# Patient Record
Sex: Female | Born: 1960 | Race: White | Hispanic: No | Marital: Married | Smoking: Never smoker
Health system: Southern US, Community
[De-identification: ages and names within clinical notes are randomized; demographics above are authoritative.]

## PROBLEM LIST (undated history)

## (undated) DIAGNOSIS — R519 Headache, unspecified: Secondary | ICD-10-CM

## (undated) DIAGNOSIS — E7800 Pure hypercholesterolemia, unspecified: Secondary | ICD-10-CM

## (undated) DIAGNOSIS — I81 Portal vein thrombosis: Secondary | ICD-10-CM

## (undated) DIAGNOSIS — F419 Anxiety disorder, unspecified: Secondary | ICD-10-CM

## (undated) DIAGNOSIS — G4700 Insomnia, unspecified: Secondary | ICD-10-CM

## (undated) DIAGNOSIS — R7303 Prediabetes: Secondary | ICD-10-CM

## (undated) HISTORY — PX: PARTIAL HYSTERECTOMY: SHX80

## (undated) HISTORY — PX: CYSTOCELE REPAIR: SHX163

## (undated) HISTORY — PX: HERNIA REPAIR: SHX51

## (undated) HISTORY — PX: TONSILLECTOMY: SUR1361

## (undated) HISTORY — PX: BUNIONECTOMY: SHX129

## (undated) HISTORY — PX: COLONOSCOPY: SHX174

---

## 2012-10-11 ENCOUNTER — Ambulatory Visit: Payer: Self-pay | Admitting: Internal Medicine

## 2012-11-08 ENCOUNTER — Inpatient Hospital Stay: Payer: Self-pay | Admitting: Internal Medicine

## 2012-11-08 LAB — URINALYSIS, COMPLETE
Bilirubin,UR: NEGATIVE
Blood: NEGATIVE
Glucose,UR: NEGATIVE mg/dL (ref 0–75)
Ketone: NEGATIVE
Nitrite: NEGATIVE
Ph: 6 (ref 4.5–8.0)
Specific Gravity: 1.015 (ref 1.003–1.030)
Squamous Epithelial: 2

## 2012-11-08 LAB — COMPREHENSIVE METABOLIC PANEL
Albumin: 4 g/dL (ref 3.4–5.0)
BUN: 19 mg/dL — ABNORMAL HIGH (ref 7–18)
Bilirubin,Total: 0.2 mg/dL (ref 0.2–1.0)
Calcium, Total: 9.1 mg/dL (ref 8.5–10.1)
Chloride: 105 mmol/L (ref 98–107)
EGFR (Non-African Amer.): >60
SGOT(AST): 36 U/L (ref 15–37)
Total Protein: 7.7 g/dL (ref 6.4–8.2)

## 2012-11-08 LAB — CBC
HCT: 40.5 % (ref 35.0–47.0)
HCT: 37.7 % (ref 35.0–47.0)
HGB: 13.1 g/dL (ref 12.0–16.0)
MCH: 31.4 pg (ref 26.0–34.0)
MCV: 91 fL (ref 80–100)
MCV: 91 fL (ref 80–100)
Platelet: 256 10*3/uL (ref 150–440)
Platelet: 223 10*3/uL (ref 150–440)
RBC: 4.15 10*6/uL (ref 3.80–5.20)
RDW: 12.4 % (ref 11.5–14.5)
RDW: 12.4 % (ref 11.5–14.5)
WBC: 13.7 10*3/uL — ABNORMAL HIGH (ref 3.6–11.0)

## 2012-11-08 LAB — CLOSTRIDIUM DIFFICILE BY PCR

## 2012-11-08 LAB — APTT: Activated PTT: 77.9 secs — ABNORMAL HIGH (ref 23.6–35.9)

## 2012-11-08 LAB — PROTIME-INR
INR: 0.9
Prothrombin Time: 12.6 secs (ref 11.5–14.7)

## 2012-11-08 LAB — LIPASE, BLOOD: Lipase: 80 U/L (ref 73–393)

## 2012-11-09 LAB — CBC WITH DIFFERENTIAL/PLATELET
Basophil %: 0.3 %
Eosinophil #: 0.1 10*3/uL (ref 0.0–0.7)
Eosinophil %: 0.7 %
HCT: 39.3 % (ref 35.0–47.0)
HGB: 13.6 g/dL (ref 12.0–16.0)
MCH: 31.6 pg (ref 26.0–34.0)
MCHC: 34.7 g/dL (ref 32.0–36.0)
MCV: 91 fL (ref 80–100)
Monocyte #: 1 x10 3/mm — ABNORMAL HIGH (ref 0.2–0.9)
Neutrophil #: 10.1 10*3/uL — ABNORMAL HIGH (ref 1.4–6.5)
Neutrophil %: 71.3 %
Platelet: 202 10*3/uL (ref 150–440)
RBC: 4.31 10*6/uL (ref 3.80–5.20)
RDW: 12.7 % (ref 11.5–14.5)
WBC: 14.1 10*3/uL — ABNORMAL HIGH (ref 3.6–11.0)

## 2012-11-09 LAB — BASIC METABOLIC PANEL
Anion Gap: 7 (ref 7–16)
BUN: 8 mg/dL (ref 7–18)
Chloride: 107 mmol/L (ref 98–107)
Co2: 25 mmol/L (ref 21–32)
Creatinine: 0.87 mg/dL (ref 0.60–1.30)
Glucose: 114 mg/dL — ABNORMAL HIGH (ref 65–99)
Osmolality: 277 (ref 275–301)
Sodium: 139 mmol/L (ref 136–145)

## 2012-11-09 LAB — WBCS, STOOL

## 2012-11-09 LAB — TSH: Thyroid Stimulating Horm: 5.93 u[IU]/mL — ABNORMAL HIGH

## 2012-11-09 LAB — MAGNESIUM: Magnesium: 1.8 mg/dL

## 2012-11-10 LAB — PLATELET COUNT
Platelet: 142 10*3/uL — ABNORMAL LOW (ref 150–440)
Platelet: 128 10*3/uL — ABNORMAL LOW (ref 150–440)
Platelet: 128 10*3/uL — ABNORMAL LOW (ref 150–440)

## 2012-11-10 LAB — STOOL CULTURE

## 2012-11-10 LAB — HEMOGLOBIN: HGB: 11 g/dL — ABNORMAL LOW (ref 12.0–16.0)

## 2012-11-11 LAB — CBC WITH DIFFERENTIAL/PLATELET
Basophil #: 0 10*3/uL (ref 0.0–0.1)
Basophil %: 0.4 %
Eosinophil #: 0.2 10*3/uL (ref 0.0–0.7)
Eosinophil %: 2.1 %
HGB: 11 g/dL — ABNORMAL LOW (ref 12.0–16.0)
Lymphocyte %: 35.3 %
MCH: 32 pg (ref 26.0–34.0)
MCHC: 35 g/dL (ref 32.0–36.0)
Monocyte %: 6.4 %
Neutrophil #: 4.1 10*3/uL (ref 1.4–6.5)
Neutrophil %: 55.8 %
Platelet: 136 10*3/uL — ABNORMAL LOW (ref 150–440)
RBC: 3.43 10*6/uL — ABNORMAL LOW (ref 3.80–5.20)
WBC: 7.4 10*3/uL (ref 3.6–11.0)

## 2012-11-11 LAB — APTT: Activated PTT: 112.6 secs — ABNORMAL HIGH (ref 23.6–35.9)

## 2012-11-12 ENCOUNTER — Ambulatory Visit: Payer: Self-pay | Admitting: Internal Medicine

## 2012-11-12 LAB — CBC CANCER CENTER
Eosinophil %: 1.1 %
HCT: 33.9 % — ABNORMAL LOW (ref 35.0–47.0)
HGB: 11.7 g/dL — ABNORMAL LOW (ref 12.0–16.0)
MCH: 31.5 pg (ref 26.0–34.0)
MCV: 91 fL (ref 80–100)
Monocyte %: 6.7 %
Neutrophil #: 5.3 x10 3/mm (ref 1.4–6.5)
Neutrophil %: 68 %
Platelet: 165 x10 3/mm (ref 150–440)
RBC: 3.72 10*6/uL — ABNORMAL LOW (ref 3.80–5.20)
RDW: 12.3 % (ref 11.5–14.5)
WBC: 7.8 x10 3/mm (ref 3.6–11.0)

## 2012-11-13 LAB — CA 125: CA 125: 15.6 U/mL (ref 0.0–34.0)

## 2012-11-14 LAB — CULTURE, BLOOD (SINGLE)

## 2012-12-11 ENCOUNTER — Ambulatory Visit: Payer: Self-pay | Admitting: Internal Medicine

## 2013-01-11 ENCOUNTER — Ambulatory Visit: Payer: Self-pay | Admitting: Internal Medicine

## 2013-02-11 ENCOUNTER — Ambulatory Visit: Payer: Self-pay | Admitting: Internal Medicine

## 2013-02-11 ENCOUNTER — Ambulatory Visit: Payer: Self-pay | Admitting: Unknown Physician Specialty

## 2013-05-13 ENCOUNTER — Ambulatory Visit: Payer: Self-pay | Admitting: Gastroenterology

## 2013-05-29 ENCOUNTER — Ambulatory Visit: Payer: Self-pay | Admitting: Internal Medicine

## 2013-06-13 ENCOUNTER — Ambulatory Visit: Payer: Self-pay | Admitting: Internal Medicine

## 2013-08-14 ENCOUNTER — Ambulatory Visit: Payer: Self-pay | Admitting: Internal Medicine

## 2013-08-14 LAB — CREATININE, SERUM
Creatinine: 0.76 mg/dL (ref 0.60–1.30)
EGFR (Non-African Amer.): >60

## 2013-09-11 ENCOUNTER — Ambulatory Visit: Payer: Self-pay | Admitting: Internal Medicine

## 2013-11-22 IMAGING — CT CT ABD-PELV W/ CM
1 of 2 series · 15 of 32 positions shown, 19 images · IV contrast (isovue)
Comparison: none

REASON FOR EXAM: (1) abd pain - lower abd, with diarrhea and blood in
stool.  eval for inflammato
COMMENTS:   May transport without cardiac monitor

PROCEDURE:     CT  - CT ABDOMEN / PELVIS  W  - November 08, 2012 [DATE]
RESULT:     Comparison:  None
TECHNIQUE: Multiple axial images of the abdomen and pelvis were performed
from the lung bases to the pubic symphysis, with p.o. contrast and with 100
mL of Isovue 370 intravenous contrast.

[Series 2: 3mm soft tissue · axial · 0.68mm/px · z∈[-1184,-766]mm · 15 of 153 slices shown, 19 images]
[im 7/153  soft-tissue]
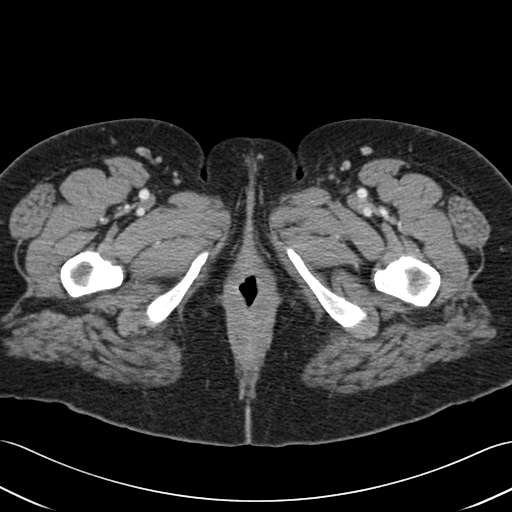
[im 7/153  bone]
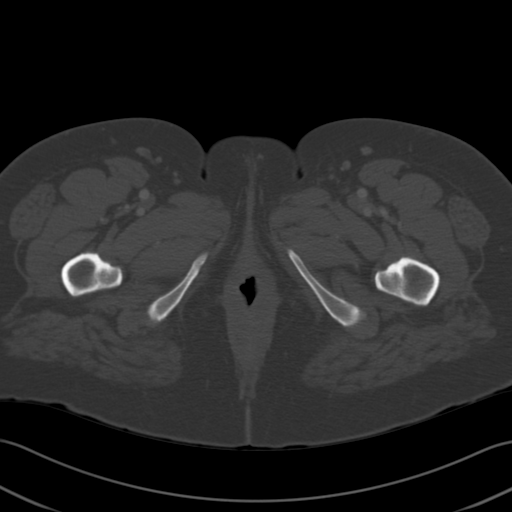
[im 20/153  soft-tissue]
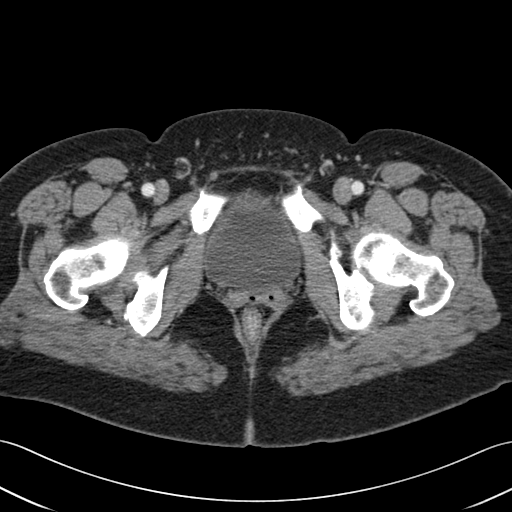
[im 32/153  soft-tissue]
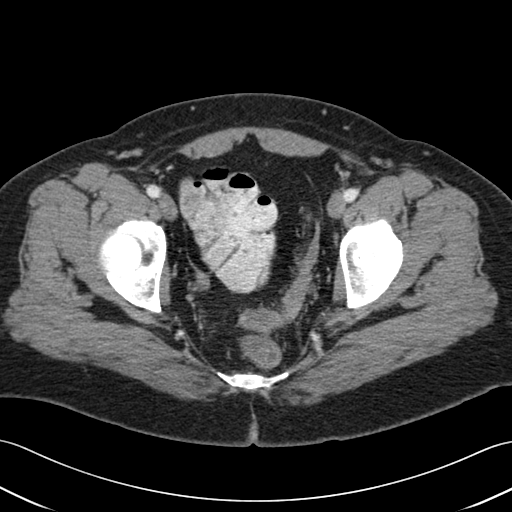
[im 45/153  soft-tissue]
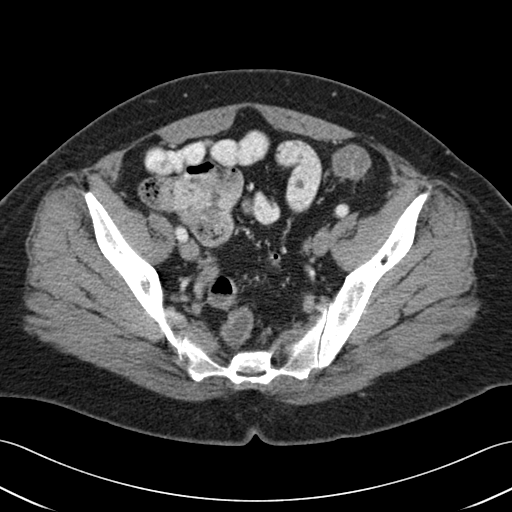
[im 51/153  soft-tissue]
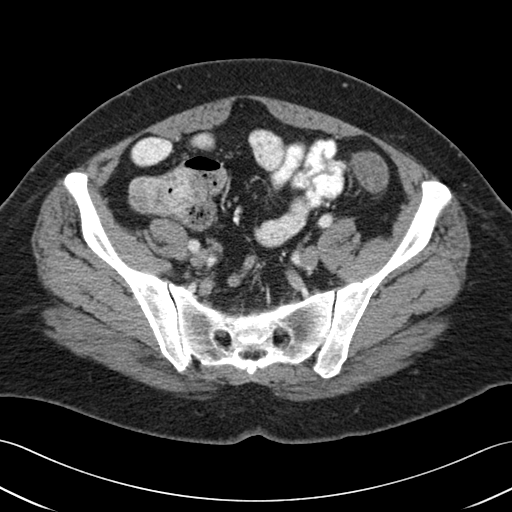
[im 64/153  soft-tissue]
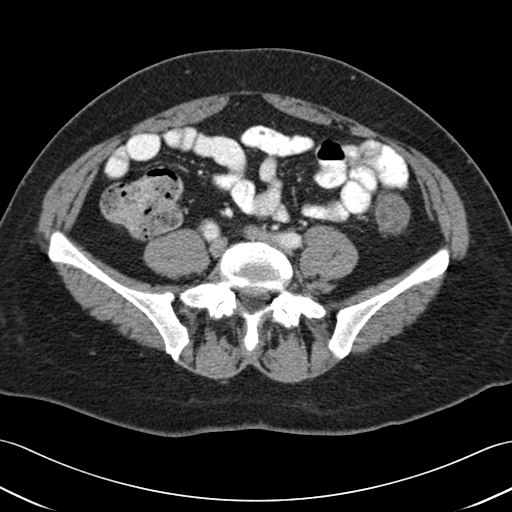
[im 77/153  soft-tissue]
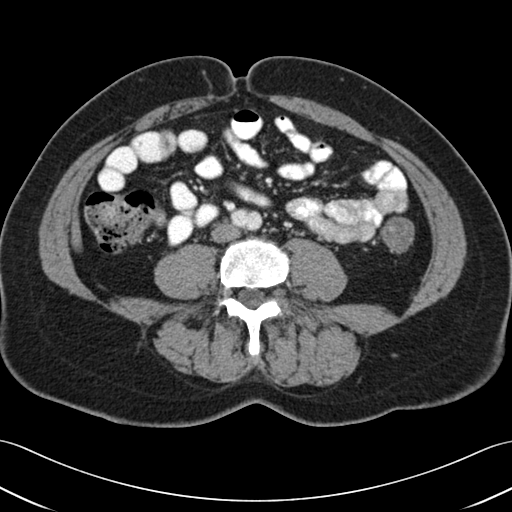
[im 89/153  soft-tissue]
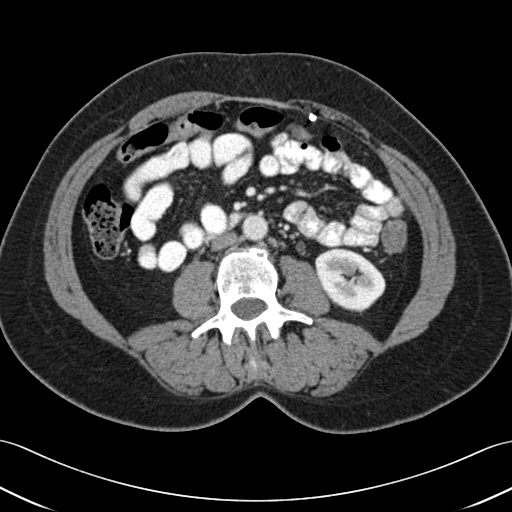
[im 102/153  soft-tissue]
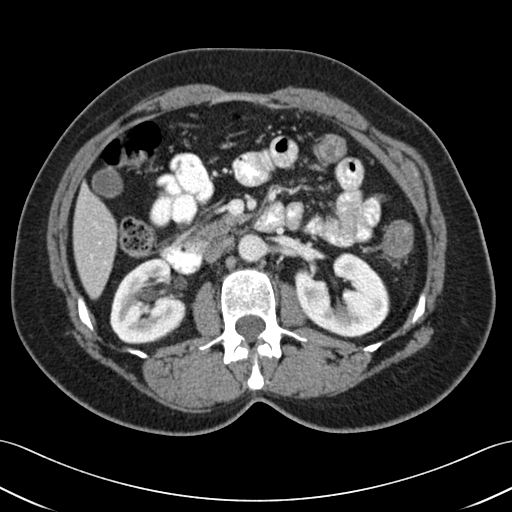
[im 102/153  bone]
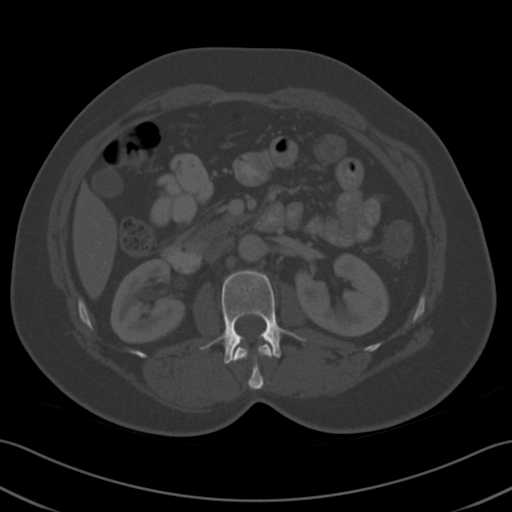
[im 108/153  soft-tissue]
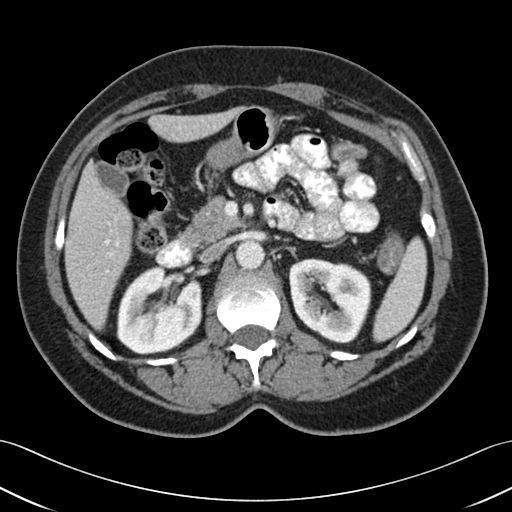
[im 121/153  soft-tissue]
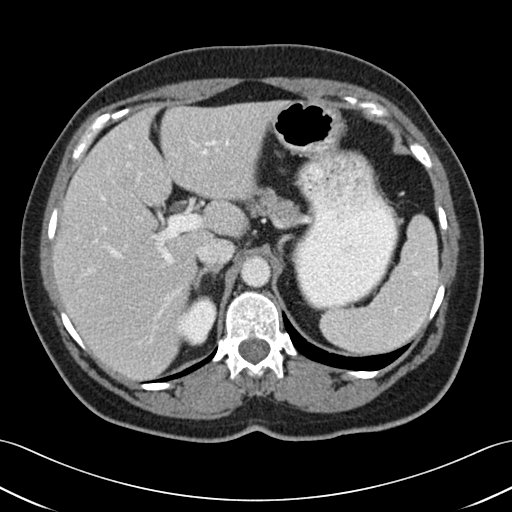
[im 127/153  lung]
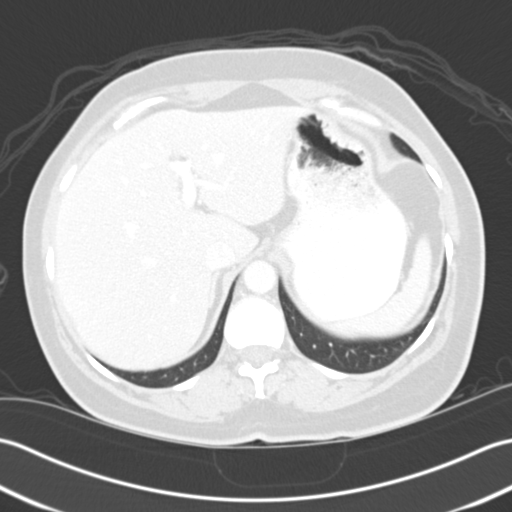
[im 134/153  soft-tissue]
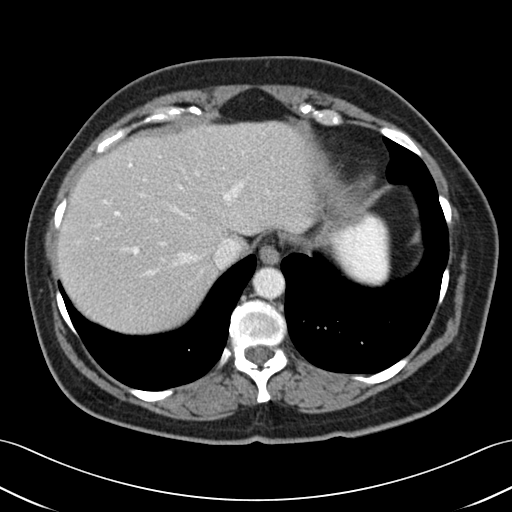
[im 134/153  lung]
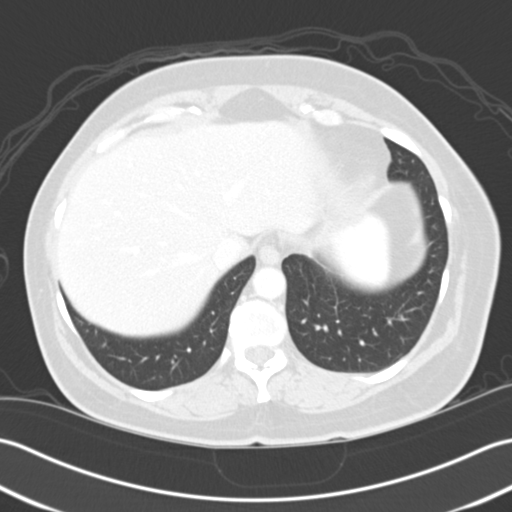
[im 140/153  lung]
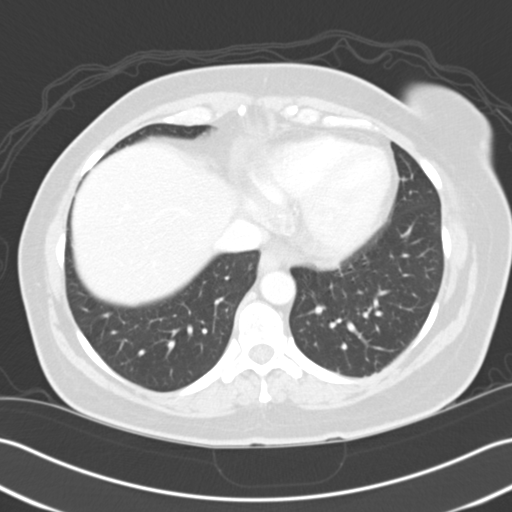
[im 146/153  soft-tissue]
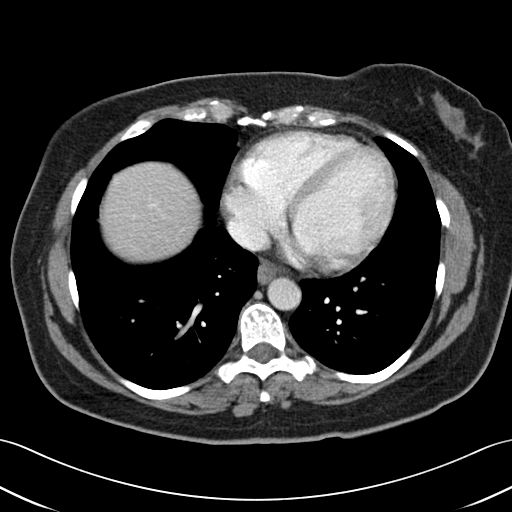
[im 146/153  lung]
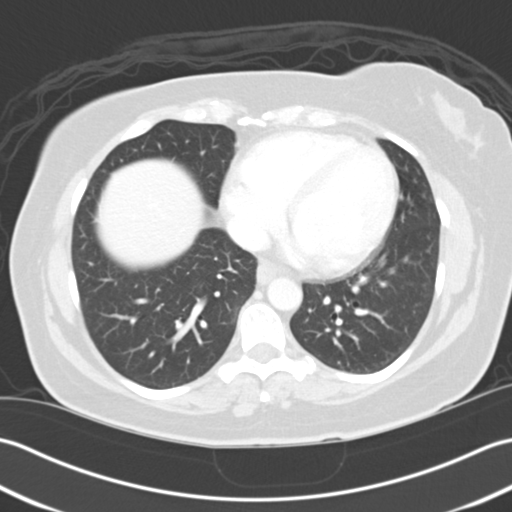

[15 of 32 positions shown; findings below may reference images not displayed]

FINDINGS: There is a small curvilinear focus of low-attenuation the posterior right
hepatic lobe which appear to be within a branch of the portal vein. This is
concerning for an area of thrombus. The gallbladder, adrenals, and pancreas
are unremarkable. Small attenuation focus in the spleen is too small to
characterize. Small attenuation focus in the right kidney is too small to
characterize.

The small and large bowel are normal in caliber. Although the descending
colon is decompressed there appears to be bowel wall thickening of the
descending colon. There may be some thickening of the proximal sigmoid colon
as well. The appendix is normal. There is a small fat-containing peribulbar
hernia. Postoperative changes seen from ventral hernia mesh repair.

No aggressive lytic or sclerotic osseous lesions are identified.
IMPRESSION: 1. Bowel wall thickening of the descending colon is concerning for colitis.
Differential include infectious, inflammatory, and ischemic etiologies.
There may be some wall thickening of the sigmoid colon as well.
2. Findings concerning for an area of thrombus within a branch of the right
portal vein.

## 2014-08-18 ENCOUNTER — Other Ambulatory Visit (HOSPITAL_COMMUNITY): Payer: Self-pay | Admitting: Internal Medicine

## 2014-10-03 NOTE — Consult Note (Signed)
Chief Complaint:  Subjective/Chief Complaint Overall better. Sl bleeding. Some diarrhea. Tolerating clears. On heparin drip.   VITAL SIGNS/ANCILLARY NOTES: **Vital Signs.:   01-Jun-14 08:50  Vital Signs Type Q 4hr  Temperature Temperature (F) 97.9  Celsius 36.6  Temperature Source oral  Pulse Pulse 50  Respirations Respirations 20  Systolic BP Systolic BP 98  Diastolic BP (mmHg) Diastolic BP (mmHg) 61  Mean BP 73  Pulse Ox % Pulse Ox % 96  Pulse Ox Activity Level  At rest  Oxygen Delivery Room Air/ 21 %   Brief Assessment:  GEN no acute distress   Cardiac Regular   Respiratory clear BS   Gastrointestinal mild LLQ tenderness   Lab Results: Routine Coag:  01-Jun-14 06:56   Activated PTT (APTT)  112.6 (A HCT value >55% may artifactually increase the APTT. In one study, the increase was an average of 19%. Reference: "Effect on Routine and Special Coagulation Testing Values of Citrate Anticoagulant Adjustment in Patients with High HCT Values." American Journal of Clinical Pathology 2006;126:400-405.)  Routine Hem:  01-Jun-14 06:56   WBC (CBC) 7.4  RBC (CBC)  3.43  Hemoglobin (CBC)  11.0  Hematocrit (CBC)  31.3  Platelet Count (CBC)  136  MCV 91  MCH 32.0  MCHC 35.0  RDW 12.2  Neutrophil % 55.8  Lymphocyte % 35.3  Monocyte % 6.4  Eosinophil % 2.1  Basophil % 0.4  Neutrophil # 4.1  Lymphocyte # 2.6  Monocyte # 0.5  Eosinophil # 0.2  Basophil # 0.0 (Result(s) reported on 11 Nov 2012 at 07:15AM.)   Assessment/Plan:  Assessment/Plan:  Assessment Portal vein thrombosis- on heparin. Ischemic colitis, clinically improving.   Plan Advance to full liquid today. Dr. Servando SnareWohl to resum care tomorrow. Thanks.   Electronic Signatures: Lutricia Feilh, Shanetta Nicolls (MD)  (Signed 01-Jun-14 09:31)  Authored: Chief Complaint, VITAL SIGNS/ANCILLARY NOTES, Brief Assessment, Lab Results, Assessment/Plan   Last Updated: 01-Jun-14 09:31 by Lutricia Feilh, Reshaun Briseno (MD)

## 2014-10-03 NOTE — Consult Note (Signed)
Chief Complaint:  Subjective/Chief Complaint Covering for Dr. Servando Snare. Some diarrhea and abd pain but no bleeding. On heparin IV   VITAL SIGNS/ANCILLARY NOTES: **Vital Signs.:   31-May-14 07:40  Vital Signs Type Q 4hr  Temperature Temperature (F) 97.5  Celsius 36.3  Temperature Source oral  Pulse Pulse 54  Systolic BP Systolic BP 115  Diastolic BP (mmHg) Diastolic BP (mmHg) 70  Mean BP 85  Pulse Ox % Pulse Ox % 94  Pulse Ox Activity Level  At rest  Oxygen Delivery Room Air/ 21 %   Brief Assessment:  GEN no acute distress   Cardiac Regular   Respiratory clear BS   Gastrointestinal mild low abd tenderness   Lab Results:  Routine Coag:  31-May-14 03:45   Activated PTT (APTT)  144.8 (A HCT value >55% may artifactually increase the APTT. In one study, the increase was an average of 19%. Reference: "Effect on Routine and Special Coagulation Testing Values of Citrate Anticoagulant Adjustment in Patients with High HCT Values." American Journal of Clinical Pathology 2006;126:400-405.)  Routine Hem:  31-May-14 03:45   Hemoglobin (CBC)  11.0 (Result(s) reported on 10 Nov 2012 at 04:38AM.)  Platelet Count (CBC)  142 (Result(s) reported on 10 Nov 2012 at 04:38AM.)   Radiology Results: CT:    29-May-14 10:58, CT Abdomen and Pelvis With Contrast  CT Abdomen and Pelvis With Contrast   REASON FOR EXAM:    (1) abd pain - lower abd, with diarrhea and blood in   stool.  eval for inflammato  COMMENTS:   May transport without cardiac monitor    PROCEDURE: CT  - CT ABDOMEN / PELVIS  W  - Nov 08 2012 10:58AM     RESULT: Comparison:  None    Technique: Multiple axial images of the abdomen and pelvis were performed   from the lung bases to the pubic symphysis, with p.o. contrast and with   100 mL of Isovue 370 intravenous contrast.    Findings:  There is a small curvilinear focus of low-attenuation the posterior right   hepatic lobe which appear to be within a branch of the portal  vein. This     is concerning for an area of thrombus. The gallbladder, adrenals, and   pancreas are unremarkable. Small attenuation focus in the spleen istoo   small to characterize. Small attenuation focus in the right kidney is too   small to characterize.    The small and large bowel are normal in caliber. Although the descending   colon is decompressed there appears to be bowel wall thickening of the   descending colon. There may be some thickening of the proximal sigmoid   colon as well. The appendix is normal. There is a small fat-containing   peribulbar hernia. Postoperative changes seen from ventral hernia mesh   repair.    No aggressive lytic or sclerotic osseous lesions are identified.    IMPRESSION:   1. Bowel wall thickening of the descending colon is concerning for   colitis. Differential include infectious, inflammatory, and ischemic   etiologies. There may be some wall thickening of the sigmoid colon as   well.  2. Findings concerning for an area of thrombus within a branch of the   right portal vein.        Verified By: Lewie Chamber, M.D., MD   Assessment/Plan:  Assessment/Plan:  Assessment Ischemic colitis. On Abx.  Portal vein thrombosis. On heparin.   Plan Await hematology consult. Usually ischemic colitis  will resolve on own.   Electronic Signatures: Lutricia Feilh, Sanjit Mcmichael (MD)  (Signed 31-May-14 10:27)  Authored: Chief Complaint, VITAL SIGNS/ANCILLARY NOTES, Brief Assessment, Lab Results, Radiology Results, Assessment/Plan   Last Updated: 31-May-14 10:27 by Lutricia Feilh, Eagle [place]a (MD)

## 2014-10-03 NOTE — Consult Note (Signed)
PATIENT NAME:  Alyssa Wu, Alyssa Wu MR#:  [id number] DATE OF BIRTH:  04-Feb-1961  DATE OF CONSULTATION:  11/08/2012  REFERRING PHYSICIAN:  Dr. Berlinda LastSanchez-Gutierrez  CONSULTING PHYSICIAN:  Joselyn ArrowKandice L. Mayes Sangiovanni, NP  PRIMARY CARE PHYSICIAN:  Not local.   GASTROENTEROLOGIST Dr. Servando SnareWohl.   REASON FOR CONSULT:  Colitis and right portal vein thrombus  HISTORY OF PRESENT ILLNESS:  The patient is a 54 year old Caucasian female who was in her usual state of health until 1:00 a.m. when she developed a "vagal episode." She reports that she has had vagal episodes about twice per year. She did develop dizziness, diaphoresis and followed by abdominal cramps, diarrhea and hematochezia. She describes bright red blood in her stool. She has seen no further bleeding. Her abdominal pain had resolved; however, she is having a few intermittent cramps at this point. She denies any history of rectal bleeding. She did have a colonoscopy 9 years and believes that was normal. She has not started any new medications although she does report taking a few over-the-counter ibuprofen and Sudafed. She has participated in two 5Ks within the last week. She says that she ran and then walked part of them. She denies any fever. Currently, her abdominal pain is 6 out of 10. She denies any nausea, vomiting, heartburn, indigestion, dysphagia, or odynophagia. Her appetite has been fine up until this point. Her weight has been stable. She has a history of chronic constipation and takes MiraLax p.r.n. for this.   CT scan of the abdomen and pelvis with contrast shows descending/sigmoid colitis and a right portal vein thrombus. Her white blood cell count was 13.7.   PAST MEDICAL AND SURGICAL HISTORY: She has history of anxiety, depression, migraine headaches, partial hysterectomy, bladder tack x 2, bunion surgery and hernioplasty with mesh repair.   MEDICATIONS PRIOR TO ADMISSION:  Ibuprofen p.r.n., Sudafed p.r.n., alprazolam 0.25 mg at bedtime, multivitamin  daily, Paxil CR 25 mg extended release at bedtime and Topamax 25 mg at bedtime.   ALLERGIES: No known drug allergies.   FAMILY HISTORY: There is no known family history of colorectal carcinoma, liver or chronic GI problems.   SOCIAL HISTORY: She is married. She has one healthy son. She denies any tobacco, alcohol or drug use. 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   REVIEW OF SYSTEMS:  See history of present illness.   MUSCULOSKELETAL: She has had some intermittent joint and muscular pain status post exercise, see history of present illness, otherwise, negative complete review of systems.   PHYSICAL EXAMINATION: VITAL SIGNS: Temperature 97.8, pulse 62, respirations 18, blood pressure 122/62, oxygen saturation 99% on room air.   GENERAL: She is a well-developed, well-nourished, Caucasian female in no acute distress. She is accompanied by her husband.   HEENT: Sclerae clear anicteric. Conjunctivae pink. Oropharynx pink and moist without any lesions.   NECK: Supple without any mass or thyromegaly.   CHEST: Heart regular rate and rhythm. Normal S1, S2. No murmurs, clicks, rubs or gallops.   LUNGS: Clear to auscultation bilaterally.   ABDOMEN: Positive bowel sounds x 4. No bruits auscultated. Abdomen is soft, nontender, nondistended without palpable mass or hepatosplenomegaly. No rebound tenderness or guarding.   EXTREMITIES: Without clubbing or edema.   PSYCHIATRIC: She is alert and cooperative. Normal mood and affect.   NEUROLOGICAL:  Grossly intact.   SKIN: Pink, warm and dry without any rash or jaundice.   LABORATORY STUDIES: Glucose 132, BUN 19, sodium 134, otherwise normal basic metabolic panel. Lipase  is 80. LFTs are normal. Hemoglobin 14, hematocrit 40.5 and platelet count 256. INR 0.9.  Imaging: See history of present illness.   IMPRESSION: The patient is a pleasant, 54 year old, Caucasian female who awakened at 1 a.m. with  acute abdominal pain and diarrhea followed by bloody stools. Her pain has somewhat resolved and she only has intermittent abdominal cramps. CT shows descending/sigmoid colitis. It also shows a right portal vein thrombus. She has been started on heparin. Vascular surgery has also been consulted. Stool studies are pending. She has been started on empiric antibiotics. She will need a colonoscopy tomorrow with Dr. Servando Snare to determine etiology of her colitis. Her symptoms are typical for ischemic colitis. We suspect she has two separate phenomenon. However, inflammatory bowel disease remains in the differential, which can cause a hypercoagulable state. She also participated in two 5K run/walks within the past week, which could lead to dehydration and hypercoagulable state as well. There was no evidence of malignancy on CT.  I have discussed her care Dr. Midge Minium.   PLAN: 1. Clear liquids today.  2. Lactic acid.  3. Anticoagulation per Vascular. She is currently on heparin.  4. Follow up on her stool studies.  5. Colonoscopy tomorrow with Dr. Servando Snare. I have discussed this procedure including risks and benefits which include but not limited to bleeding, infection, perforation drug reaction. She agrees and consent will be obtained. Hold heparin 6 hours prior to colonoscopy.  6. Hematology consult.  7. Agree with empiric antibiotic therapy.  8. Will follow up on CBC in the morning.   We would like to thank you for allowing [place] to participate in the care of the patient.  ____________________________ Joselyn Arrow, NP klj:[id number] D: 11/08/2012 15:17:36 ET T: 11/08/2012 16:17:59 ET JOB#: [id number]  cc: Joselyn Arrow, NP, <Dictator> Outside Physician Joselyn Arrow FNP ELECTRONICALLY SIGNED 11/14/2012 14:08

## 2014-10-03 NOTE — Consult Note (Signed)
Brief Consult Note: Diagnosis: acute colitis, portal vein thrombus.   Patient was seen by consultant.   Comments: Mrs. Alyssa Wu is a pleasant 54 year old Caucasian female who awakened at 1 AM with acute abdominal pain, diarrhea, followed by bloody stools.  Her pain has somewhat resolved & she has only intermittent cramps.  CT shows descending/sigmoid colitis.  It also shows a right portal vein thrombus.  She has been started on heparin.  Vascular surgeon has been consulted.    Stool studies are pending.  She has been started on empiric antibiotics.   She will need a colonoscopy tomorrow with Dr. Servando SnareWohl to  determine etiology of her colitis.  Her symptoms are typical for ischemic colitis.   We suspect she has two separate phenomenon.  However, inflammatory bowel disease remains in the differential which can cause a hypercoagulable state.  She also participated in 2- 5K run/walks within the last week which could lead to dehydration and hypercoagulable state. There was no evidence of malignancy on CT scan.  I have discussed her care w/ Dr Midge Miniumarren Wohl.  Plan: 1) Clear liquids today 2) lactic acid 3) anticoagulation per vascular 4) follow-up stool studies 5) colonoscopy tomorrow with Dr. Servando SnareWohl 6) hematology consult 7) Hold heparin 6 hours prior to colonoscopy 8) Agree with empiric antibiotic therapy 9) CBC AM Thanks for consult [id number].  Electronic Signatures: Joselyn ArrowJones, Marck Mcclenny L (NP)  (Signed 29-May-14 15:17)  Authored: Brief Consult Note   Last Updated: 29-May-14 15:17 by Joselyn ArrowJones, Vihaan Gloss L (NP)

## 2014-10-03 NOTE — H&P (Signed)
PATIENT NAME:  Alyssa Wu, Alyssa Wu MR#:  [id number] DATE OF BIRTH:  17-Jan-1961  DATE OF ADMISSION:  11/08/2012  REFERRING PHYSICIAN: Janalyn Harder, MD  PRIMARY CARE PHYSICIAN:   Nonlocal.   CHIEF COMPLAINT: Abdominal pain, crampy, and bright blood per rectum.   HISTORY OF PRESENT ILLNESS:  Alyssa Wu is a very nice 54 year old female who has history of depression, anxiety and migraines. Overall she has been in her normal state of health with no major changes on her regular day to day condition. Last night, around 1:00 a.m., she started developing some cramping, suprapubic and in both lower quadrants of the stomach. The cramping became a little bit more pronounced to the point that the patient needed to run to the bathroom and have a bowel movement. Bowel movement had diarrhea.  The first couple episodes were just watery, brown diarrhea, but after the third one the patient started having some bright red blood per rectum. First it started as streaks of blood and then became like to whole stool was red. The patient started having some sweating and chills at the same time associated with all of this and she states that she had a vagal  response.  By vagal response, she felt like she was dizzy, lightheaded and about to pass out. This is something that has happened through all her life occasionally as the patient states that it is normal for her to over react to certain things almost passing out. She has had more than 10 bowel movements with diarrhea. At this moment starting to slow down, but the patient still has significant cramping. The pain and the cramping is around 5 or 6 out of 10. No radiation.  Located in both lower quadrants and suprapubic area. Again cramping like, occasionally changes to just residual soreness. She has no associated fever per se, but she states that she gets really warm at home, but this happens often because she thinks she is going through menopause. The patient is admitted because of  findings on the CT scan. The patient has colitis and also thrombosis of the portal vein. The case was commented with vascular surgery and GI recommended that the patient have anticoagulation with heparin.   REVIEW OF SYSTEMS: CONSTITUTIONAL: The patient gets hot at night with sweating occasionally, but she does not know if it is fever.  No weakness. No weight loss or weight gain.  EYES: No blurry vision, double vision, changes in eyesight.  ENT: No tinnitus. No difficulty swallowing. No postnasal drip.  RESPIRATORY: No cough, wheezing, hemoptysis, dyspnea or COPD. CARDIOVASCULAR: No chest pain, orthopnea, edema, arrhythmias, syncope or palpitations.  GASTROINTESTINAL: No nausea or vomiting. Positive mild abdominal pain. No hematemesis. No melena. Positive bright red blood per rectum.  No hemorrhoids. Positive abdominal pain, as mentioned above.  GENITOURINARY: No dysuria, hematuria or changes in frequency.  GYNECOLOGIC: No tenderness on breasts or masses.  ENDOCRINOLOGY: No polyuria, polydipsia, polyphagia, cold or heat intolerance, although the patient is going through menopause. She feels sweaty at night.  HEMATOLOGIC AND LYMPHATIC: No anemia, easy bruising, bleeding or swollen glands.  SKIN: No rashes, petechiae or change in moles.  MUSCULOSKELETAL: No significant back pain, neck pain or gout.  NEUROLOGIC: No numbness, tingling. No ataxia. No vertigo.  PSYCH:  Positive migraine headaches.  PSYCHIATRIC: No significant problems. She does have anxiety and depression which has been well controlled on low dose of medication. No bipolar disorder.   PAST MEDICAL HISTORY: 1.  Anxiety and depression.  2.  Migraines.   PAST SURGICAL HISTORY: 1.  Hernioplasty with mesh repair.  2.  Partial hysterectomy.  3.  Bladder tack x 2.  4.  Bunion surgery.   ALLERGIES: No known drug allergies.   FAMILY HISTORY: Positive for COPD in her father who also had a CVA. No cancer. No MI in the family.    SOCIAL HISTORY: The patient has never smoked. She drinks very seldomly. She lives with her husband and her older son. She works as an Production designer, theatre/television/filmadministrator.   MEDICATIONS:  1.  Topamax 50 mg once a day at bedtime.  2.  Alprazolam 0.25 mg at bedtime. 3.  Multivitamins once daily. 4.  Paxil CR 25 mg once a day.   PHYSICAL EXAMINATION: VITAL SIGNS: Blood pressure 122/62, pulse 62, respirations 18, temperature 97.8.  GENERAL: Alert, oriented x 3. No acute distress. No respiratory distress. Hemodynamically stable.  HEENT: Pupils are equal and reactive. Extraocular movements are intact. Mucosa dry. Anicteric sclerae. Pink conjunctivae. No oral lesions. No oropharyngeal exudates.  NECK: Supple. No JVD. No thyromegaly. No adenopathy. No carotid bruits. No rigidity. HEART:  Regular rate and rhythm. No murmurs, rubs or gallops. No displacement of PMI. No tenderness to palpation to anterior chest wall.  LUNGS: Clear without any wheezing or crepitus. No use of accessory muscles.  ABDOMEN: Soft. Tender to palpation on suprapubic area and left lower quadrant. No rebound. No guarding.  Bowel sounds are positive. No hepatosplenomegaly. No tenderness to palpation of the liver.  GENITAL: Deferred.  EXTREMITIES: No edema, no cyanosis, no clubbing. Pulses +2. Capillary refill less than 3.  MUSCULOSKELETAL: No significant joint effusions or tenderness of the joints. Normal range of motion.  SKIN: Without any significant wounds or petechiae.  No rashes. No diaphoresis.  LYMPHATIC: Negative for lymphadenopathy in neck or supraclavicular area.  VASCULAR: Good pedal pulses. Pulses are equal in all 4 extremities. Capillary refill less than 3.  NEUROLOGIC: Cranial nerves II through XII intact.  Strength is 5/5 in all 4 extremities. No unilateral symptoms.  PSYCHIATRIC: Negative for significant agitation or depression. She is alert and oriented x 3.   LABORATORY AND DIAGNOSTICS:  CT of the abdomen, as mentioned above, has  bowel wall thickening of the descending colon concerning for colitis and findings concerning for an area of thrombus within the branch of right portal vein and this is at the level of the right hepatic lobe.   Her white count is slightly elevated at 13.7. Hemoglobin is 14, hematocrit 40. Her INR is 0.9.  UA:  No signs of infection. Sodium is 134. BUN is 19.  Glucose 132. Other electrolytes within normal limits.   ASSESSMENT AND PLAN: A 54 year old female with history of anxiety, depression and migraines who comes with new onset colitis and thrombosis of the portal vein.  1.  Colitis. The patient has elevation of white count. She has not had a fever over here, but she might be spiking fevers at home as she felt chills and very hot overnight. She is not tachycardic. She is not looking septic or toxic. She is hemodynamically stable. At this moment, we are going to give her only clear liquid diet. We are going to get a GI consultation. Vascular surgery was curbsided. They recommended treatment with heparin, which is also concerning because the patient had bright red blood per rectum, so we are going to monitor her hemoglobin closely.  As far as treatment for the colitis, it seems there is a possibility of septic thrombi into  the liver.  We have to cover for Escherichia coli and Bacteroides which are the most frequent bacteria to cause thrombosis of the portal circulation and septic thrombosis for what we are going to choose Zosyn. This is also because there is a shortage of metronidazole across the [place]. We are going to obtain blood cultures prior to the antibiotics and also stool cultures and stool white blood cells.  2.  Thrombosis of the right portal vein. The patient is going to be started on heparin. This could be a septic thrombi, although the patient does not look septic or toxic.  The patient does not have any history of previous thrombosis.  This is not to the level of Budd-Chiari, as the patient does  not have liver failure, but this could get over  that syndrome if the thrombus progresses for what I think is a good idea to start her on heparin. We just have to watch very closely as far as bleeding as the patient presented with bright red blood per rectum.  3.  Lower gastrointestinal bleeding. At this moment heparin is indicated based on the findings of thrombosis of the liver. We are going to be careful and check hemoglobins every 6 hours. The patient is hemodynamically stable. Her hemoglobin is high. If there is any significant bleeding, we will transfuse. At this moment, the bleeding could be related to increase of portal pressure due to the blockage, although she still has good patent left portal vein circulation.  4.  Anxiety and depression. Continue treatment with Paxil and benzos.  5.  Migraines. Continue Topamax.  6.  Gastrointestinal prophylaxis. We are going to add on double dose of PPI as the patient is going to be on heparin.  7.  Deep vein thrombosis prophylaxis. The patient is on heparin drip and SCDs.   CODE STATUS:  The patient is a FULL CODE.  TIME SPENT: I spent about 50 minutes with this admission.  ____________________________ Felipa Furnace, MD rsg:sb D: 11/08/2012 13:24:06 ET T: 11/08/2012 14:10:39 ET JOB#: [id number]  cc: Felipa Furnace, MD, <Dictator> Omere Marti Juanda Chance MD ELECTRONICALLY SIGNED 11/19/2012 1:50

## 2014-10-03 NOTE — Consult Note (Signed)
PATIENT NAME:  Alyssa Wu, Alyssa Wu MR#:  [id number] DATE OF BIRTH:  [birth year]  DATE OF CONSULTATION:  11/10/2012  CONSULTING PHYSICIAN:  Knute Neuobert G. Lorre NickGittin, MD  HISTORY OF PRESENT ILLNESS:  Alyssa Wu is a 54 year old patient who was admitted on May 29 and  I was consulted and evaluated the patient on May 31 and then subsequent discussed with hospitalist regarding the case during hospitalization, prior to discharge. This full narrative is delayed until the current time. Alyssa Wu was admitted initially with abdominal pain that started fairly suddenly, cramping diffuse and then developed diarrhea and then some blood. She had some dizziness interpreted as a vagal response. She came to the hospital and had clinical exam and CT scanning. This showed thickening of the colon, evidence of colitis and is evidence of some portal vein thrombosis. The patient was hospitalized, given IV fluids, initially started on Zosyn. Laboratory studies were documented, baseline labs were checked and intravenous heparin was started. GI was consulted. Colonoscopy has been done that showed some evidence of ischemic colitis. The patient was followed and hematology is consulted. Some studies for hypercoagulable were then ordered. It was noted that her baseline PT and PTT were normal. There is no history of clotting. There was no recent obvious proceeding illness.   PAST MEDICAL HISTORY: Anxiety, depression, migraine well tolerated, prior hernioplasty with mesh, partial hysterectomy, bladder tack and bunions.   ALLERGIES: No known allergies.   FAMILY HISTORY: Told initial hospitalist there was no cancer, but indicated later in follow up that there was some family history of colon cancer.   SOCIAL HISTORY: Rare social alcohol. Never was a smoker.   MEDICATIONS AT THE TIME OF ADMISSION: Also noted on admission note and included Topamax 50 daily at night, alprazolam 0.25 at night, multivitamins daily. Paxil CR 25 a day.   PHYSICAL  EXAMINATION: GENERAL: When I saw the patient, she was alert and cooperative and significant symptoms were resolved. Slight abdominal discomfort alert, oriented, not in distress. Mood and affect were unremarkable.  HEENT: Sclerae clear.   MOUTH: No thrush.  LYMPH: No palpable lymph nodes in the neck, supraclavicular, submandibular, or axilla.  HEART: Regular.  ABDOMEN: Minimally tender in the lower quadrants. There was no guarding or rebound.  LUNGS: Clear. No wheezing or rales.  EXTREMITIES: No edema.   NEUROLOGIC: Grossly nonfocal. Moving all extremities equally. Cranial nerves intact.  SKIN: No bruising or rash.   LABORATORY, DIAGNOSTIC, AND RADIOLOGICAL DATA: CT findings have already been noted.   The white count was initially elevated at 13.7, later down, hemoglobin 14 came down over time with dilution and with bleeding, baseline PT and PTT were normal. Urinalysis was normal. Electrolytes creatinine and liver functions were unremarkable.   IMPRESSION AND PLAN:  The patient is with portal thrombosis and ischemic colitis. No prior history of clot. No family history of clots. No personal history of malignancy. Baseline labs were unremarkable. Baseline exam was otherwise unremarkable. CT of abdomen and pelvis showed no masses, no acute pancreatitis. A number of studies were sent off including tumor markers and hypercoagulable studies. Initially, discussed with medicine regarding heparin and recommended transition over to Coumadin or patient for personal preference to avoid prolonged hospitalization. Avoid bridging for convenience, there is indication the patient would prefer Xarelto. The choice of anticoagulation was left to medicine and discussed with GI prior to the patient being discharged. She chose to go on have early discharge Xarelto and was recommended [facility] to review clinically,  check all the parameters for hypercoagulable, double check the family history, make  sure CBC normalizes and later discussed length of anticoagulation and other work-up.    ____________________________ Knute Neu. Lorre Nick, MD rgg:cc D: 11/18/2012 15:25:33 ET T: 11/18/2012 16:55:52 ET JOB#: [id number]  cc: Knute Neu. Lorre Nick, MD, <Dictator> Marin Roberts MD ELECTRONICALLY SIGNED 11/30/2012 13:57

## 2014-10-03 NOTE — Discharge Summary (Signed)
PATIENT NAME:  Alyssa Wu, Alyssa MR#:  [id number] DATE OF BIRTH:  Dec 04, 1960  DATE OF ADMISSION:  11/08/2012 DATE OF DISCHARGE:  11/11/2012  PRESENTING COMPLAINT: Abdominal pain and bright red blood per rectum.   DISCHARGE DIAGNOSES: 1.  Acute ischemic colitis.  2.  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 Work-up in progress.   CONSULTANTS:  GI - Dr. Midge Miniumarren Wu; hematology - Dr. Lorre Wu.  PROCEDURES: Colonoscopy done by Dr. Servando SnareWohl on 30th of May showed segmental severe inflammation found in the descending colon secondary to ischemic colitis.   CODE STATUS: FULL CODE.   DISCHARGE MEDICATIONS: 1.  Topamax 25 mg 2 tablets at bedtime.  2.  Paxil CR 25 mg extended release daily at bedtime.  3.  Xanax 0.25 mg p.o. at bedtime.  4.  Multivitamin p.o. daily.  5.  Augmentin 875 mg b.i.d.  6.  Xarelto 15 mg b.i.d. for 21 days and then continue 20 mg daily.  DISCHARGE INSTRUCTIONS:  The patient is to follow up with Dr. Lorre Wu, Monday, June 2nd at 11:30 a.m.  Follow up with primary care physician in 1 to 2 weeks.  Follow up with Dr. Midge Miniumarren Wu in 1 to 2 weeks.   DISCHARGE LABORATORY AND DIAGNOSTICS:  CEA is 2.0. CA-125 is 15.6. CA-19-9 is 6.  H and H are 11.0 and 31.3.  White count is 7.4. Her platelet count is 128. Hemoglobin is 11.1. White count on admission was 14.1. TSH is 5.93.  Magnesium 1.8. Lactic acid 1.0. C. diff was negative.  Stool comprehensive negative. Stool WBC is positive. Blood culture negative in 48 hours.   CT of abdomen showed bowel thickening of the descending colon concerning for colitis. Findings concerning for area of thrombus within the branch of right portal vein.   LFTs within normal limits. Urinalysis negative for UTI.   BRIEF SUMMARY OF HOSPITAL COURSE: Alyssa Wu is a pleasant 54 year old Caucasian female with history of anxiety and depression who came in with abdominal cramping and bright red blood per rectum. She was admitted with:  1.  Acute  ischemic colitis. The patient was started on IV fluids, kept n.p.o.  Dr. Servando SnareWohl was consulted for GI bleed and did colonoscopy which revealed findings suggestive of ischemic colitis in the descending colon area. She was on empiric IV Zosyn, which was changed to p.o. Augmentin, to be finished as outpatient. The patient's rectal bleed resolved and she remained afebrile and did not have any abdominal pain. She was tolerating full liquid diet prior to discharge.  2.  Incidental finding of right portal vein thrombosis. Dr. Lorre Wu was consulted. The patient was started on IV heparin drip. Hypercoagulable workup was sent out by Dr. Lorre Wu, which will be followed by Dr. Lorre Wu as outpatient. She was changed to p.o. Xarelto for treatment of her right portal vein thrombosis until the etiology is determined. The patient did not have further rectal bleed.  3.  Anxiety and depression.  Her treatment with Paxil, benzos and Topamax was continued.   Hospital stay otherwise remained stable.  The patient remained a FULL CODE.   TIME SPENT: 40 minutes.  ____________________________ Alyssa HailSona A. Allena KatzPatel, MD sap:sb D: 11/12/2012 06:54:44 ET T: 11/12/2012 09:55:02 ET JOB#: [id number]  cc: Alyssa Krutz A. Allena KatzPatel, MD, <Dictator> Alyssa Neuobert G. Lorre NickGittin, MD Alyssa Miniumarren Wohl, MD Alyssa OraSONA A Emelie Newsom MD ELECTRONICALLY SIGNED 11/15/2012 19:37

## 2016-07-07 ENCOUNTER — Ambulatory Visit
Admission: EM | Admit: 2016-07-07 | Discharge: 2016-07-07 | Disposition: A | Discharge status: Home / Self Care | Payer: 59 | Attending: Emergency Medicine | Admitting: Emergency Medicine

## 2016-07-07 DIAGNOSIS — R1084 Generalized abdominal pain: Secondary | ICD-10-CM | POA: Diagnosis not present

## 2016-07-07 LAB — URINALYSIS, COMPLETE (UACMP) WITH MICROSCOPIC
BILIRUBIN URINE: NEGATIVE
Bacteria, UA: NONE SEEN
Glucose, UA: NEGATIVE mg/dL
Hgb urine dipstick: NEGATIVE
Ketones, ur: NEGATIVE mg/dL
Leukocytes, UA: NEGATIVE
NITRITE: NEGATIVE
Protein, ur: NEGATIVE mg/dL
RBC / HPF: NONE SEEN RBC/hpf (ref 0–5)
SPECIFIC GRAVITY, URINE: 1.01 (ref 1.005–1.030)
WBC UA: NONE SEEN WBC/hpf (ref 0–5)
pH: 7 (ref 5.0–8.0)

## 2016-07-07 MED ORDER — TIZANIDINE HCL 4 MG PO TABS: 4.0000 mg | ORAL_TABLET | Freq: Three times a day (TID) | ORAL | 0 refills | Status: DC | PRN

## 2016-07-07 NOTE — ED Triage Notes (Signed)
Patient complains of mid upper right abdominal pain. Patient states that area feels funny. Patient states that she is also having right low back pain. Patient also reports that she is having some odor to her urine. Patient states that she has been working out on Monday and was wondering if this could have came from the workout.

## 2016-07-07 NOTE — Discharge Instructions (Signed)
Take 1 g of Tylenol 4 times a day as needed for pain. Continue the heating pad. Try the Zanaflex. It is very good for muscle spasms. He should be getting better in the next 2 or 3 days, follow up with your primary care physician if you're not improving. Go immediately to the ER if you get worse or for the signs and symptoms we discussed

## 2016-07-07 NOTE — ED Provider Notes (Signed)
HPI  SUBJECTIVE:  Alyssa Wu is a 56 y.o. female who presents with constant right upper quadrant/right flank pain that radiates around to her back and occasionally radiates down her flank for the past 2-3 days. She states the symptoms started after doing a heavy workout with abdominal exercises. She states that she cannot characterize the pain. She tried a heating pad with some improvement of symptoms. She also tried extra strength Tylenol, last dose was more than 6-8 hours prior to evaluation. No aggravating factors. No nausea, vomiting, fevers, coughing, wheezing, chest pain, shortness of breath. No abdominal rash, trauma. No abdominal distention. Had a bowel movement this morning. No melena, hematochezia. No dysuria, urgency, frequency, cloudy urine or hematuria. She does report some odorous urine. His abdominal pain is not associated eating, movement, urination, defecation, torso rotation, coughing, sitting up. She has a past medical history of a blood clot in the portal vein/liver for which she takes Xarelto. Also history of constipation for which she takes MiraLAX daily. No history of UTIs, pyelonephritis, nephrolithiasis. No history of gallbladder disease, pancreatitis, lung disease, diabetes, hypertension, PE, DVT, cancer or history of MI, coronary artery disease, atrial fibrillation. No history of ulcerative colitis, irritable bowel syndrome. She is status post an abdominal wall hernia repair. Family history negative for nephrolithiasis. PMD: [facility] at [facility].   History reviewed. No pertinent past medical history.  Past Surgical History:  Procedure Laterality Date  . HERNIA REPAIR    . PARTIAL HYSTERECTOMY      History reviewed. No pertinent family history.  Social History  Substance Use Topics  . Smoking status: Never Smoker  . Smokeless tobacco: Never Used  . Alcohol use No    No current facility-administered medications for this encounter.   Current  Outpatient Prescriptions:  .  ALPRAZolam (XANAX) 0.5 MG tablet, Take 0.5 mg by mouth at bedtime as needed for anxiety., Disp: , Rfl:  .  PARoxetine (PAXIL) 20 MG tablet, Take 20 mg by mouth daily., Disp: , Rfl:  .  Rivaroxaban (XARELTO) 15 MG TABS tablet, Take 15 mg by mouth 2 (two) times daily with a meal., Disp: , Rfl:  .  topiramate (TOPAMAX) 25 MG tablet, Take 25 mg by mouth 2 (two) times daily., Disp: , Rfl:  .  tiZANidine (ZANAFLEX) 4 MG tablet, Take 1 tablet (4 mg total) by mouth every 8 (eight) hours as needed for muscle spasms., Disp: 30 tablet, Rfl: 0  No Known Allergies   ROS  As noted in HPI.   Physical Exam  BP 139/78 (BP Location: Left Arm)   Pulse 71   Temp 98 F (36.7 C) (Oral)   Resp 18   Ht 5\' 4"  (1.626 m)   Wt 195 lb (88.5 kg)   SpO2 99%   BMI 33.47 kg/m   Constitutional: Well developed, well nourished, no acute distress Eyes: PERRL, EOMI, conjunctiva normal bilaterally HENT: Normocephalic, atraumatic,mucus membranes moist Respiratory: Clear to auscultation bilaterally, no rales, no wheezing, no rhonchi Cardiovascular: Normal rate and rhythm, no murmurs, no gallops, no rubs GI: Normal appearance. Several small healed laparoscopic scars. No apparent hernias. Diffuse tenderness maximal over the right flank. Negative Murphy, negative McBurney. Active bowel sounds. No masses, Soft, nondistended, no rebound, no guarding Back: mild bilateral CVAT. No paralumbar tenderness skin: No rash, skin intact Musculoskeletal: no tenderness, no deformities Neurologic: Alert & oriented x 3, CN II-XII grossly intact, no motor deficits, sensation grossly intact Psychiatric: Speech and behavior appropriate   ED Course  Medications - No data to display  Orders Placed This Encounter  Procedures  . Urinalysis, Complete w Microscopic    Standing Status:   Standing    Number of Occurrences:   1   Results for orders placed or performed during the hospital encounter of  07/07/16 (from the past 24 hour(s))  Urinalysis, Complete w Microscopic     Status: Abnormal   Collection Time: 07/07/16  7:42 PM  Result Value Ref Range   Color, Urine STRAW (A) YELLOW   APPearance CLEAR CLEAR   Specific Gravity, Urine 1.010 1.005 - 1.030   pH 7.0 5.0 - 8.0   Glucose, UA NEGATIVE NEGATIVE mg/dL   Hgb urine dipstick NEGATIVE NEGATIVE   Bilirubin Urine NEGATIVE NEGATIVE   Ketones, ur NEGATIVE NEGATIVE mg/dL   Protein, ur NEGATIVE NEGATIVE mg/dL   Nitrite NEGATIVE NEGATIVE   Leukocytes, UA NEGATIVE NEGATIVE   Squamous Epithelial / LPF 0-5 (A) NONE SEEN   WBC, UA NONE SEEN 0 - 5 WBC/hpf   RBC / HPF NONE SEEN 0 - 5 RBC/hpf   Bacteria, UA NONE SEEN NONE SEEN   No results found.  ED Clinical Impression  Generalized abdominal pain   ED Assessment/Plan  Vitals normal, no antipyretic in the past 6-8 hours. Presentation suggestive of a abdominal wall strain given the new workout. Think stone less likely given absence of hematuria. No evidence of UTI. No evidence of cholecystitis. Could be cholelithiasis but pain would be atypical. Doubt PE, pneumonia. Doubt recurrent blood clots in the portal system that she is already on Xarelto. Plan to send home with some zanaflex, no opiates since this is abdominal pain of uncertain etology. Pt to continue tylenol 1 gm qid.  Up with PMD in several days. To the ER if she gets worse.  Discussed labs, MDM, plan and followup with patient. Discussed sn/sx that should prompt return to the ED. Patient agrees with plan.   Meds ordered this encounter  Medications  . Rivaroxaban (XARELTO) 15 MG TABS tablet    Sig: Take 15 mg by mouth 2 (two) times daily with a meal.  . topiramate (TOPAMAX) 25 MG tablet    Sig: Take 25 mg by mouth 2 (two) times daily.  Marland Kitchen. PARoxetine (PAXIL) 20 MG tablet    Sig: Take 20 mg by mouth daily.  Marland Kitchen. ALPRAZolam (XANAX) 0.5 MG tablet    Sig: Take 0.5 mg by mouth at bedtime as needed for anxiety.  Marland Kitchen. tiZANidine  (ZANAFLEX) 4 MG tablet    Sig: Take 1 tablet (4 mg total) by mouth every 8 (eight) hours as needed for muscle spasms.    Dispense:  30 tablet    Refill:  0    *This clinic note was created using Scientist, clinical (histocompatibility and immunogenetics)Dragon dictation software. Therefore, there may be occasional mistakes despite careful proofreading.  ?   Domenick GongAshley Rowe Warman, MD 07/07/16 2126

## 2016-07-18 ENCOUNTER — Emergency Department: Payer: 59

## 2016-07-18 ENCOUNTER — Emergency Department
Admission: EM | Admit: 2016-07-18 | Discharge: 2016-07-18 | Disposition: A | Discharge status: Home / Self Care | Payer: 59 | Attending: Emergency Medicine | Admitting: Emergency Medicine

## 2016-07-18 ENCOUNTER — Encounter: Payer: Self-pay | Admitting: Emergency Medicine

## 2016-07-18 DIAGNOSIS — Z79899 Other long term (current) drug therapy: Secondary | ICD-10-CM | POA: Diagnosis not present

## 2016-07-18 DIAGNOSIS — R1011 Right upper quadrant pain: Secondary | ICD-10-CM | POA: Insufficient documentation

## 2016-07-18 DIAGNOSIS — M25511 Pain in right shoulder: Secondary | ICD-10-CM | POA: Insufficient documentation

## 2016-07-18 DIAGNOSIS — R0789 Other chest pain: Secondary | ICD-10-CM | POA: Diagnosis present

## 2016-07-18 LAB — CBC
HCT: 39.3 % (ref 35.0–47.0)
Hemoglobin: 13.9 g/dL (ref 12.0–16.0)
MCH: 32.1 pg (ref 26.0–34.0)
MCHC: 35.4 g/dL (ref 32.0–36.0)
MCV: 90.7 fL (ref 80.0–100.0)
PLATELETS: 266 10*3/uL (ref 150–440)
RBC: 4.33 MIL/uL (ref 3.80–5.20)
RDW: 12.6 % (ref 11.5–14.5)
WBC: 7.2 10*3/uL (ref 3.6–11.0)

## 2016-07-18 LAB — BASIC METABOLIC PANEL
Anion gap: 8 (ref 5–15)
BUN: 12 mg/dL (ref 6–20)
CO2: 25 mmol/L (ref 22–32)
CREATININE: 0.71 mg/dL (ref 0.44–1.00)
Calcium: 9.1 mg/dL (ref 8.9–10.3)
Chloride: 100 mmol/L — ABNORMAL LOW (ref 101–111)
GFR calc Af Amer: >60 mL/min (ref >60)
Glucose, Bld: 130 mg/dL — ABNORMAL HIGH (ref 65–99)
Potassium: 3.9 mmol/L (ref 3.5–5.1)
SODIUM: 133 mmol/L — AB (ref 135–145)

## 2016-07-18 LAB — TROPONIN I: Troponin I: <0.03 ng/mL (ref <0.03)

## 2016-07-18 MED ORDER — HYDROMORPHONE HCL 2 MG PO TABS: 2.0000 mg | ORAL_TABLET | Freq: Four times a day (QID) | ORAL | 0 refills | Status: AC | PRN

## 2016-07-18 MED ORDER — LIDOCAINE 5 % EX PTCH: 1.0000 | MEDICATED_PATCH | CUTANEOUS | 0 refills | Status: DC

## 2016-07-18 MED ORDER — KETOROLAC TROMETHAMINE 30 MG/ML IJ SOLN
30.0000 mg | Freq: Once | INTRAMUSCULAR | Status: AC
  Administered 2016-07-18: 30 mg via INTRAMUSCULAR
  Filled 2016-07-18: qty 1

## 2016-07-18 MED ORDER — LORAZEPAM 1 MG PO TABS: 1.0000 mg | ORAL_TABLET | Freq: Two times a day (BID) | ORAL | 0 refills | Status: AC | PRN

## 2016-07-18 NOTE — Discharge Instructions (Signed)
Please drink plenty of fluids and take Tylenol 1 g (2 extra strength tablets) every 8 hours around-the-clock. Moist warm heat. Use the arm sling when up and active. Please contact both your primary physician and orthopedist for further outpatient follow-up  Please return immediately if condition worsens. Please contact her primary physician or the physician you were given for referral. If you have any specialist physicians involved in her treatment and plan please also contact them. Thank you for using [place] [facility] emergency [facility].

## 2016-07-18 NOTE — ED Triage Notes (Signed)
Pt  Reports right sholder pain radiating down into right arm and into right side chest.

## 2016-07-18 NOTE — ED Provider Notes (Signed)
Time Seen: Approximately 1904  I have reviewed the triage notes  Chief Complaint: Shoulder Pain and Chest Pain   History of Present Illness: Alyssa Wu is a 56 y.o. female who has a history of multiple medical problems. She's been recently evaluated for some right upper quadrant abdominal pain. Today the patient states that after doing some painting yesterday she's developed some posterior right shoulder and anterior right upper chest wall pain. States pains exacerbated by movement. She denies any shortness of breath, nausea, vomiting. She denies any pleuritic or obvious positional though worse with movement states that she had trouble sleeping last night. She was seen by her primary physician and was prescribed some muscle relaxants along with Ultram for pain  History reviewed. No pertinent past medical history.  There are no active problems to display for this patient.   Past Surgical History:  Procedure Laterality Date  . HERNIA REPAIR    . PARTIAL HYSTERECTOMY      Past Surgical History:  Procedure Laterality Date  . HERNIA REPAIR    . PARTIAL HYSTERECTOMY      Current Outpatient Rx  . Order #: [id number] Class: Historical Med  . Order #: [id number] Class: Print  . Order #: [id number] Class: Print  . Order #: [id number] Class: Print  . Order #: [id number] Class: Historical Med  . Order #: [id number] Class: Historical Med  . Order #: [id number] Class: Normal  . Order #: [id number] Class: Historical Med    Allergies:  Patient has no known allergies.  Family History: No family history on file.  Social History: Social History  Substance Use Topics  . Smoking status: Never Smoker  . Smokeless tobacco: Never Used  . Alcohol use No     Review of Systems:   10 point review of systems was performed and was otherwise negative:  Constitutional: No fever Eyes: No visual disturbances ENT: No sore throat, ear pain Cardiac: No left-sidedchest pain Respiratory: No shortness of  breath, wheezing, or stridor Abdomen: No abdominal pain, no vomiting, No diarrhea Endocrine: No weight loss, No night sweats Extremities: No peripheral edema, cyanosis Skin: No rashes, easy bruising Neurologic: No focal weakness, trouble with speech or swollowing Urologic: No dysuria, Hematuria, or urinary frequency She denies any recent trauma  Physical Exam:  ED Triage Vitals  Enc Vitals Group     BP 07/18/16 1501 (!) 141/62     Pulse Rate 07/18/16 1501 65     Resp 07/18/16 1501 16     Temp 07/18/16 1501 98 F (36.7 C)     Temp Source 07/18/16 1501 Oral     SpO2 07/18/16 1501 99 %     Weight 07/18/16 1501 190 lb (86.2 kg)     Height 07/18/16 1501 5\' 4"  (1.626 m)     Head Circumference --      Peak Flow --      Pain Score 07/18/16 1509 10     Pain Loc --      Pain Edu? --      Excl. in GC? --     General: Awake , Alert , and Oriented times 3; GCS 15 Head: Normal cephalic , atraumatic Eyes: Pupils equal , round, reactive to light Nose/Throat: No nasal drainage, patent upper airway without erythema or exudate.  Neck: Supple, Full range of motion, No anterior adenopathy or palpable thyroid masses Lungs: Clear to ascultation without wheezes , rhonchi, or rales Heart: Regular rate, regular rhythm without murmurs , gallops , or rubs Abdomen: Soft, non tender without rebound,  guarding , or rigidity; bowel sounds positive and symmetric in all 4 quadrants. No organomegaly .        Extremities:patient is tender primarily across the right trapezius right glenoid and right anterior upper chest wall region. She does have pain with lateral motion of the right shoulder. No crepitus or step-off noted no right upper extremity is neurovascularly intact with 2+ radial and normal pulses. Normal function over the radial medial and ulnar nerve distribution. Neurologic: normal ambulation, Motor symmetric without deficits, sensory intact Skin: warm, dry, no rashes   Labs:   All laboratory work  was reviewed including any pertinent negatives or positives listed below:  Labs Reviewed  BASIC METABOLIC PANEL - Abnormal; Notable for the following:       Result Value   Sodium 133 (*)    Chloride 100 (*)    Glucose, Bld 130 (*)    All other components within normal limits  CBC  TROPONIN I    EKG: ED ECG REPORT I, Jennye Moccasin, the attending physician, personally viewed and interpreted this ECG.  Date: 07/18/2016 EKG Time: 1518 Rate: 63 Rhythm: normal sinus rhythm QRS Axis: normal Intervals: normal ST/T Wave abnormalities: nonspecific ST-T wave abnormality Conduction Disturbances: none Narrative Interpretation: unremarkable   Radiology: "Dg Chest 2 View  Result Date: 07/18/2016 CLINICAL DATA:  Patient states she has right side chest pain that began as abdominal pain 1 week ago and has worsened today. Patient states pain is radiating up into her right shoulder and posteriorly along the lower ribs. EXAM: CHEST  2 VIEW COMPARISON:  None. FINDINGS: The heart size and mediastinal contours are within normal limits. Both lungs are clear. The visualized skeletal structures are unremarkable. IMPRESSION: No active cardiopulmonary disease. Electronically Signed   By: Elige Ko   On: 07/18/2016 18:10   Dg Shoulder Right  Result Date: 07/18/2016 CLINICAL DATA:  Acute onset of right shoulder pain while painting a room in her home last night. No known injury. EXAM: RIGHT SHOULDER - 2+ VIEW COMPARISON:  None. FINDINGS: No evidence of acute fracture or glenohumeral dislocation. Subacromial space well preserved. Acromioclavicular joint intact. Well preserved bone mineral density. No intrinsic osseous abnormality. IMPRESSION: Normal examination. Electronically Signed   By: Hulan Saas M.D.   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 She states she's been on a blood thinner now for several years and it has been some discussion about stopping the blood thinner. I advised her we would avoid nonsteroidals for home usage at this time until she is able to review whether or not she needs a blood thinner at this time. The patient will be prescribed medication for pain and I felt this was unlikely to be a life-threatening intrathoracic cause based on her clinical presentation and objective findings. This most likely is either a rotator cuff injury or impingement syndrome.She does have an orthopedic surgeon that she seemed before in the past and she may require MRI evaluation*     Assessment: Right shoulder musculoskeletal pain   Final Clinical Impression:  Final diagnoses:  Acute pain of right shoulder     Plan: * Outpatient " Discharge Medication List as of 07/18/2016  8:19 PM    START taking these  medications   Details  HYDROmorphone (DILAUDID) 2 MG tablet Take 1 tablet (2 mg total) by mouth every 6 (six) hours as needed for severe pain., Starting Mon 07/18/2016, Until Thu 07/21/2016, Print    lidocaine (LIDODERM) 5 % Place 1 patch onto the skin daily., Starting Mon 07/18/2016, Print    LORazepam (ATIVAN) 1 MG tablet Take 1 tablet (1 mg total) by mouth 2 (two) times daily as needed (muscle spasm)., Starting Mon 07/18/2016, Until Sat 07/23/2016, Print      " Patient was advised to return immediately if condition worsens. Patient was advised to follow up with their primary care physician or other specialized physicians involved in their outpatient care. The patient and/or family member/power of attorney had laboratory results reviewed at the bedside. All questions and concerns were addressed and appropriate discharge instructions were distributed by the nursing staff.             Jennye Moccasin, MD 07/18/16 2104

## 2017-08-01 IMAGING — CR DG CHEST 2V
1 series · 2 of 2 positions shown · non-contrast
Comparison: None.

CLINICAL DATA: Patient states she has right side chest pain that
began as abdominal pain 1 week ago and has worsened today. Patient
states pain is radiating up into her right shoulder and posteriorly
along the lower ribs.

EXAM:
CHEST  2 VIEW

[Series 1: dg chest 2 view · 0.14mm/px · 2 of 2 slices shown]
[im 1/2]
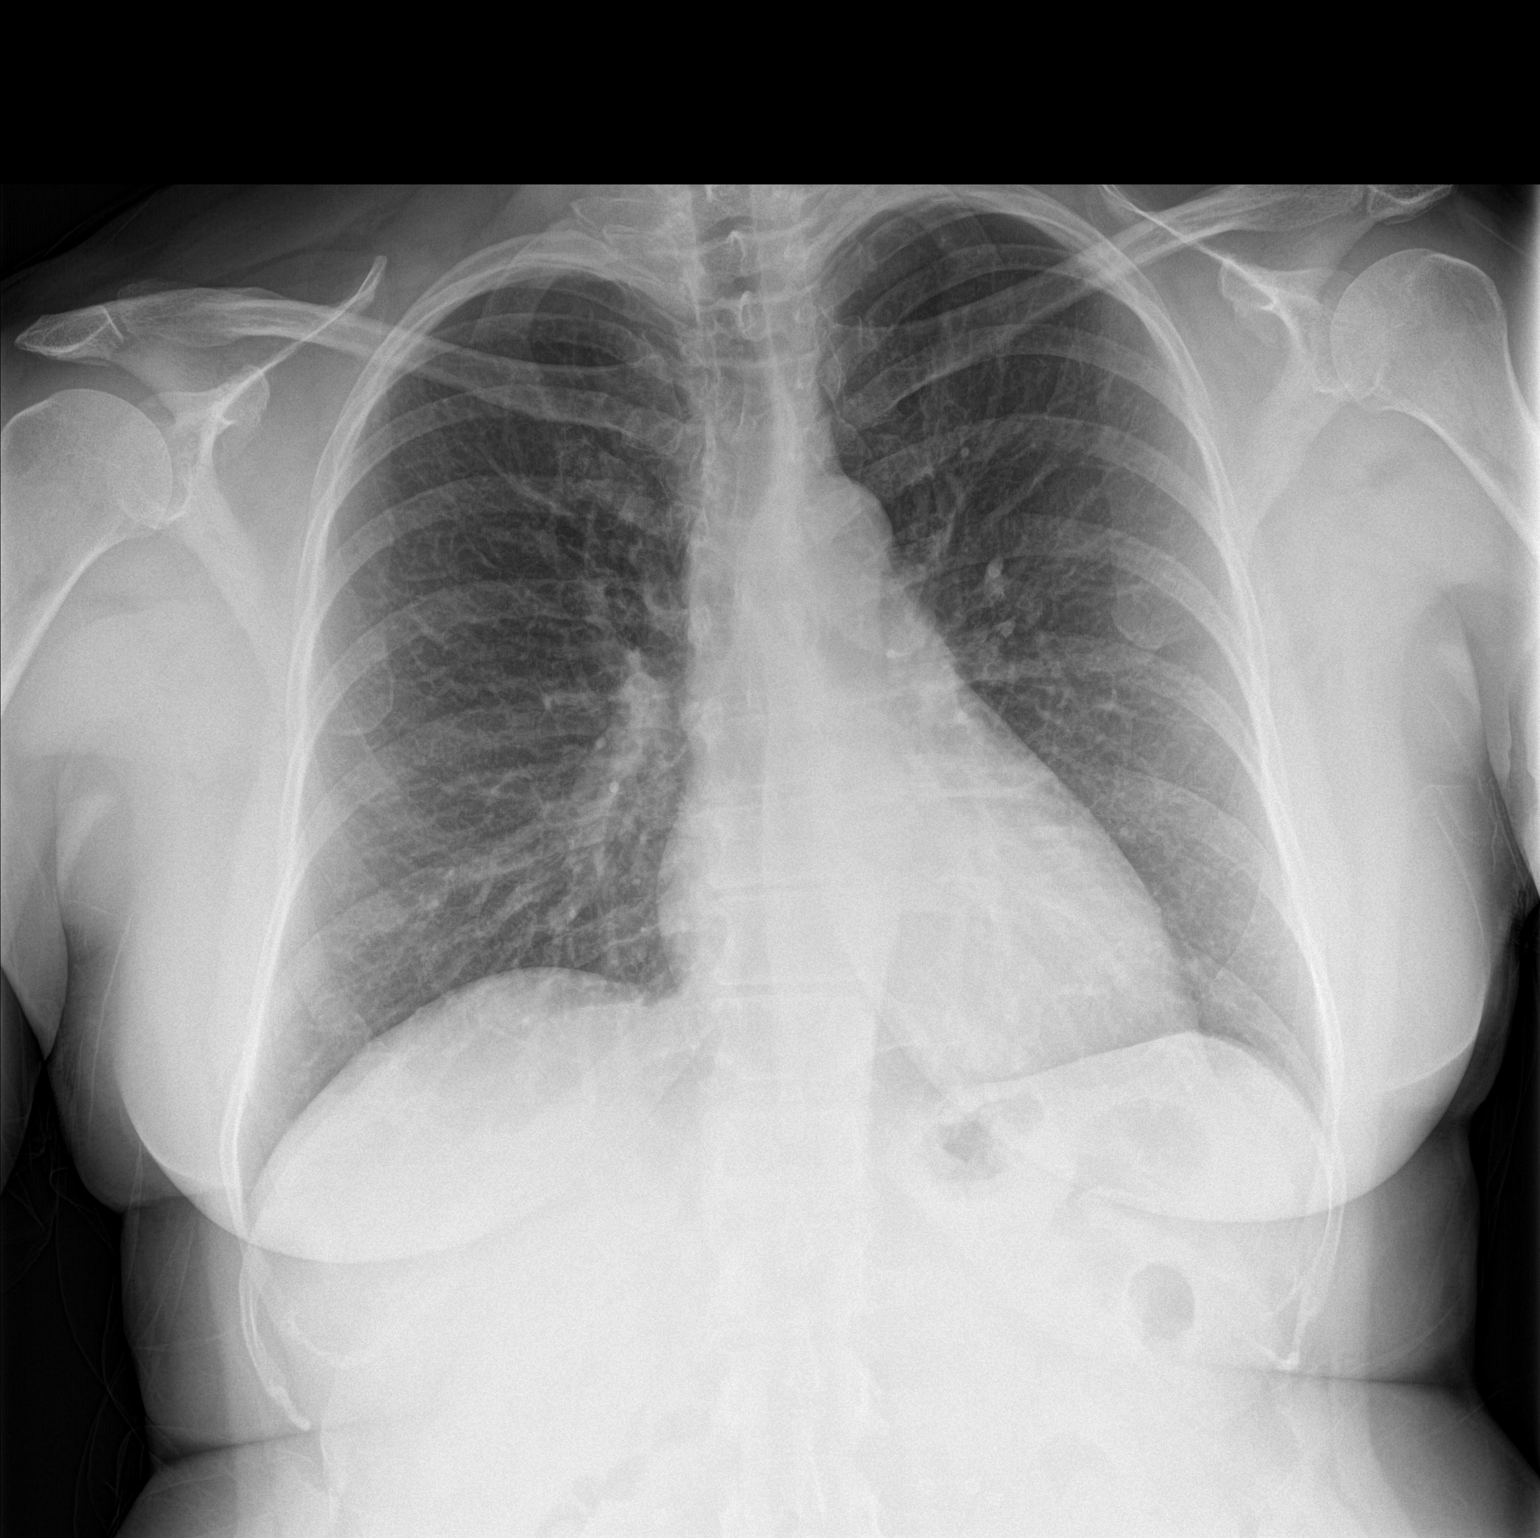
[im 2/2]
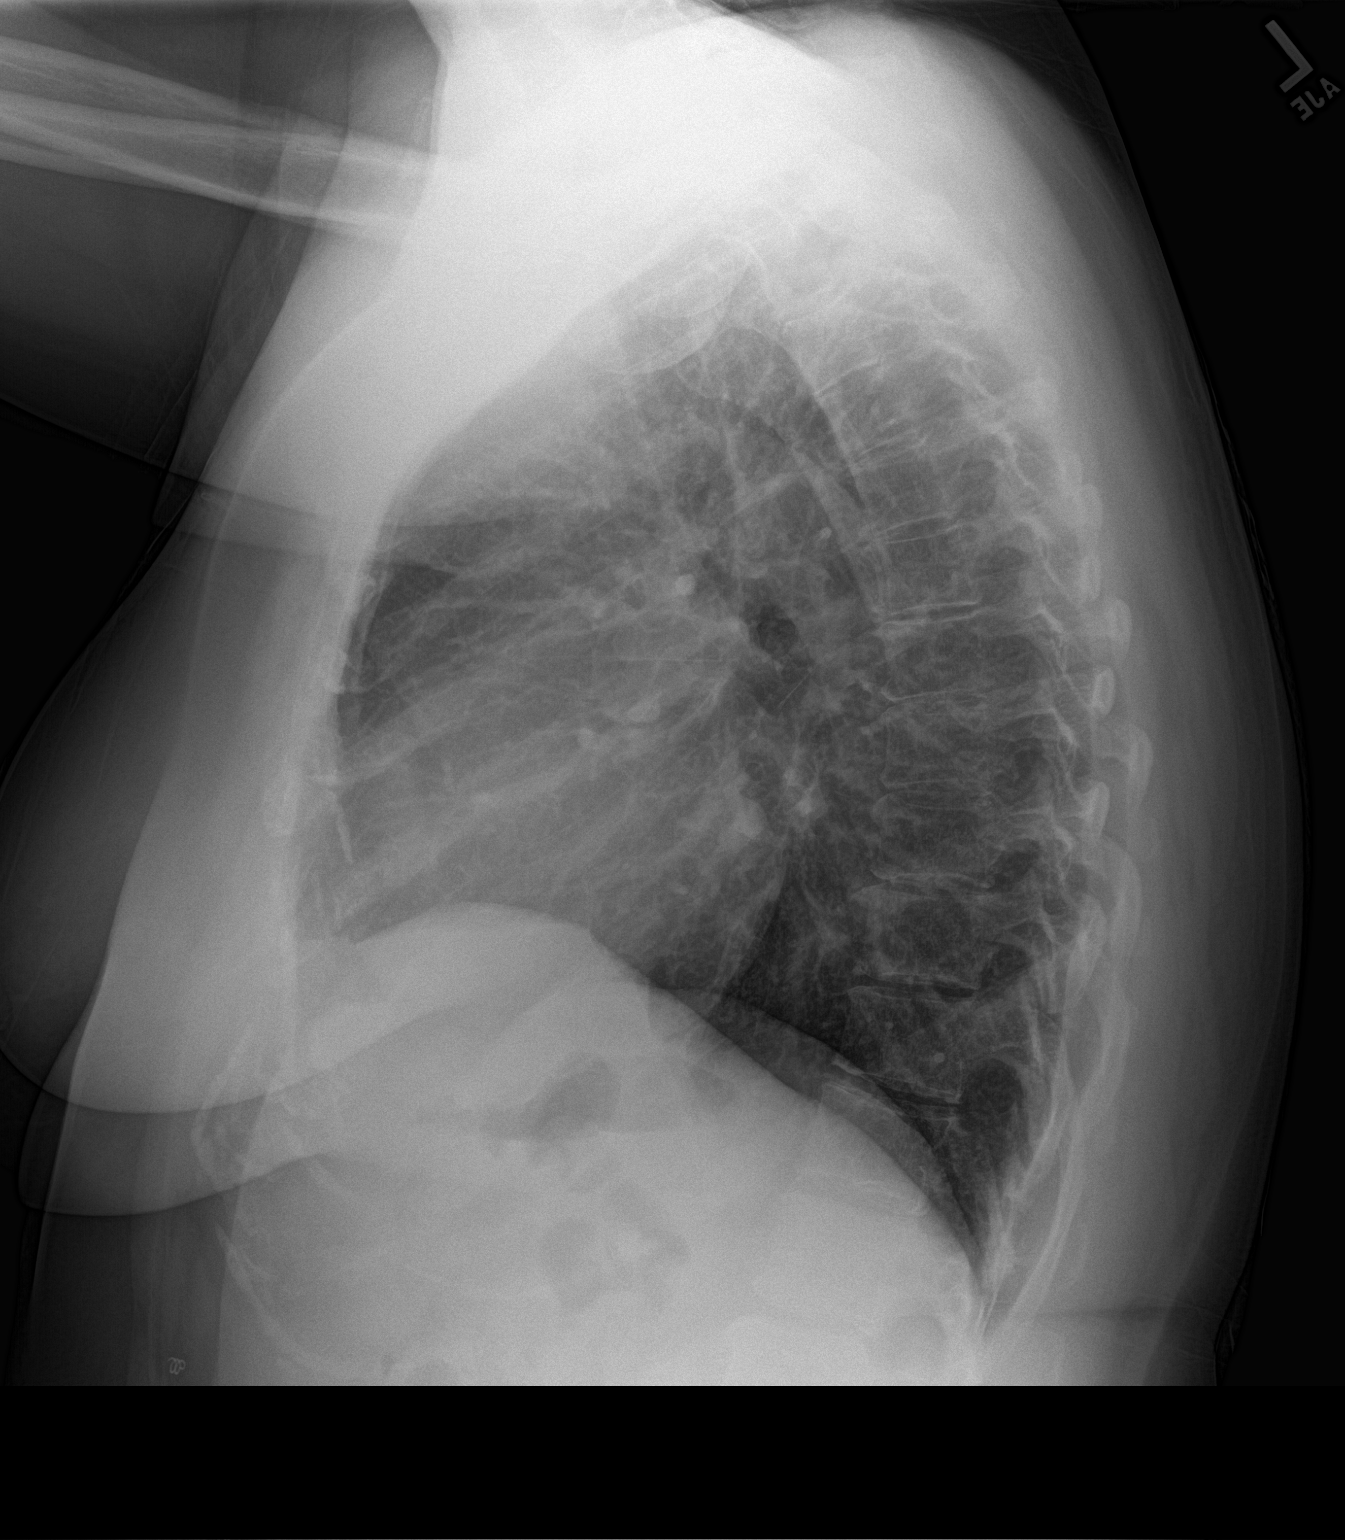

[2 of 2 positions shown; findings below may reference images not displayed]

FINDINGS: The heart size and mediastinal contours are within normal limits.
Both lungs are clear. The visualized skeletal structures are
unremarkable.
IMPRESSION: No active cardiopulmonary disease.

## 2017-10-05 ENCOUNTER — Telehealth: Payer: Self-pay | Admitting: Gastroenterology

## 2017-10-05 NOTE — Telephone Encounter (Signed)
Pt left vm to find out when her last colonoscopy was attempted to call pt back to inform her it was 05/13/13

## 2017-11-14 ENCOUNTER — Telehealth: Payer: Self-pay | Admitting: Gastroenterology

## 2017-11-14 NOTE — Telephone Encounter (Signed)
Patient LVM wondering if she is due for a colonoscopy. Please call

## 2017-11-17 NOTE — Telephone Encounter (Signed)
Left vm letting pt know she is due for her repeat colonoscopy in 05/2023.

## 2022-12-01 ENCOUNTER — Other Ambulatory Visit: Payer: Self-pay | Admitting: Surgery

## 2022-12-06 ENCOUNTER — Other Ambulatory Visit: Payer: Self-pay

## 2022-12-06 ENCOUNTER — Encounter
Admission: RE | Admit: 2022-12-06 | Discharge: 2022-12-06 | Disposition: A | Discharge status: Home / Self Care | Payer: 59 | Source: Ambulatory Visit | Attending: Surgery | Admitting: Surgery

## 2022-12-06 VITALS — Ht 63.0 in | Wt 185.0 lb

## 2022-12-06 DIAGNOSIS — E7800 Pure hypercholesterolemia, unspecified: Secondary | ICD-10-CM

## 2022-12-06 HISTORY — DX: Anxiety disorder, unspecified: F41.9

## 2022-12-06 HISTORY — DX: Insomnia, unspecified: G47.00

## 2022-12-06 HISTORY — DX: Pure hypercholesterolemia, unspecified: E78.00

## 2022-12-06 HISTORY — DX: Prediabetes: R73.03

## 2022-12-06 HISTORY — DX: Portal vein thrombosis: I81

## 2022-12-06 HISTORY — DX: Headache, unspecified: R51.9

## 2022-12-06 NOTE — Patient Instructions (Addendum)
Your procedure is scheduled on: Wednesday 12/14/22 To find out your arrival time, please call [phone] between 1PM - 3PM on:   Tuesday 12/13/22 Report to the Registration Desk on the 1st floor of the [facility]. Free Valet parking is available.  If your arrival time is 6:00 am, do not arrive before that time as the [facility] entrance doors do not open until 6:00 am.  REMEMBER: Instructions that are not followed completely may result in serious medical risk, up to and including death; or upon the discretion of your surgeon and anesthesiologist your surgery may need to be rescheduled.  Do not eat food after midnight the night before surgery.  No gum chewing or hard candies.  You may however, drink CLEAR liquids up to 2 hours before you are scheduled to arrive for your surgery. Do not drink anything within 2 hours of your scheduled arrival time.  Clear liquids include: - water  - apple juice without pulp - gatorade (not RED colors) - black coffee or tea (Do NOT add milk or creamers to the coffee or tea) Do NOT drink anything that is not on this list.  Type 1 and Type 2 diabetics should only drink water.  In addition, your doctor has ordered for you to drink the provided:  Ensure Pre-Surgery Clear Carbohydrate Drink  Drinking this carbohydrate drink up to two hours before surgery helps to reduce insulin resistance and improve patient outcomes. Please complete drinking 2 hours before scheduled arrival time.  One week prior to surgery: Stop Anti-inflammatories (NSAIDS) such as Advil, Aleve, Ibuprofen, Motrin, Naproxen, Naprosyn and Aspirin based products such as Excedrin, Goody's Powder, BC Powder. You may however, continue to take Tylenol if needed for pain up until the day of surgery.  Stop ANY OVER THE COUNTER supplements until after surgery.  Continue taking all prescription medications.   TAKE ONLY THESE MEDICATIONS THE MORNING OF SURGERY WITH A SIP OF WATER:  none  No  Alcohol for 24 hours before or after surgery.  No Smoking including e-cigarettes for 24 hours before surgery.  No chewable tobacco products for at least 6 hours before surgery.  No nicotine patches on the day of surgery.  Do not use any "recreational" drugs for at least a week (preferably 2 weeks) before your surgery.  Please be advised that the combination of cocaine and anesthesia may have negative outcomes, up to and including death. If you test positive for cocaine, your surgery will be cancelled.  On the morning of surgery brush your teeth with toothpaste and water, you may rinse your mouth with mouthwash if you wish. Do not swallow any toothpaste or mouthwash.  Use CHG Soap or wipes as directed on instruction sheet.  Do not wear lotions, powders, or perfumes.   Do not shave body hair from the neck down 48 hours before surgery.  Wear comfortable clothing (specific to your surgery type) to the hospital.  Do not wear jewelry, make-up, hairpins, clips or nail polish.  Contact lenses, hearing aids and dentures may not be worn into surgery.  Do not bring valuables to the hospital. [facility] is not responsible for any missing/lost belongings or valuables.   Notify your doctor if there is any change in your medical condition (cold, fever, infection).  If you are being discharged the day of surgery, you will not be allowed to drive home. You will need a responsible individual to drive you home and stay with you for 24 hours after surgery.  If you are taking public transportation, you will need to have a responsible individual with you.  If you are being admitted to the hospital overnight, leave your suitcase in the car. After surgery it may be brought to your room.  In case of increased patient census, it may be necessary for you, the patient, to continue your postoperative care in the Same Day Surgery department.  After surgery, you can help prevent lung complications by doing  breathing exercises.  Take deep breaths and cough every 1-2 hours. Your doctor may order a device called an Incentive Spirometer to help you take deep breaths. When coughing or sneezing, hold a pillow firmly against your incision with both hands. This is called "splinting." Doing this helps protect your incision. It also decreases belly discomfort.  Surgery Visitation Policy:  Patients undergoing a surgery or procedure may have two family members or support persons with them as long as the person is not COVID-19 positive or experiencing its symptoms.   Inpatient Visitation:    Visiting hours are 7 a.m. to 8 p.m. Up to four visitors are allowed at one time in a patient room. The visitors may rotate out with other people during the day. One designated support person (adult) may remain overnight.  Please call the Pre-admissions Testing Dept. at [phone] if you have any questions about these instructions.     Preparing for Surgery with CHLORHEXIDINE GLUCONATE (CHG) Soap  Chlorhexidine Gluconate (CHG) Soap  o An antiseptic cleaner that kills germs and bonds with the skin to continue killing germs even after washing  o Used for showering the night before surgery and morning of surgery  Before surgery, you can play an important role by reducing the number of germs on your skin.  CHG (Chlorhexidine gluconate) soap is an antiseptic cleanser which kills germs and bonds with the skin to continue killing germs even after washing.  Please do not use if you have an allergy to CHG or antibacterial soaps. If your skin becomes reddened/irritated stop using the CHG.  1. Shower the NIGHT BEFORE SURGERY and the MORNING OF SURGERY with CHG soap.  2. If you choose to wash your hair, wash your hair first as usual with your normal shampoo.  3. After shampooing, rinse your hair and body thoroughly to remove the shampoo.  4. Use CHG as you would any other liquid soap. You can apply CHG directly to  the skin and wash gently with a scrungie or a clean washcloth.  5. Apply the CHG soap to your body only from the neck down. Do not use on open wounds or open sores. Avoid contact with your eyes, ears, mouth, and genitals (private parts). Wash face and genitals (private parts) with your normal soap.  6. Wash thoroughly, paying special attention to the area where your surgery will be performed.  7. Thoroughly rinse your body with warm water.  8. Do not shower/wash with your normal soap after using and rinsing off the CHG soap.  9. Pat yourself dry with a clean towel.  10. Wear clean pajamas to bed the night before surgery.  12. Place clean sheets on your bed the night of your first shower and do not sleep with pets.  13. Shower again with the CHG soap on the day of surgery prior to arriving at the hospital.  14. Do not apply any deodorants/lotions/powders.  15. Please wear clean clothes to the hospital.

## 2022-12-07 ENCOUNTER — Encounter: Payer: Self-pay | Admitting: Urgent Care

## 2022-12-07 ENCOUNTER — Encounter
Admission: RE | Admit: 2022-12-07 | Discharge: 2022-12-07 | Disposition: A | Discharge status: Home / Self Care | Payer: 59 | Source: Ambulatory Visit | Attending: Surgery | Admitting: Surgery

## 2022-12-07 DIAGNOSIS — Z01818 Encounter for other preprocedural examination: Secondary | ICD-10-CM | POA: Diagnosis present

## 2022-12-07 DIAGNOSIS — Z01810 Encounter for preprocedural cardiovascular examination: Secondary | ICD-10-CM | POA: Diagnosis not present

## 2022-12-07 DIAGNOSIS — E7800 Pure hypercholesterolemia, unspecified: Secondary | ICD-10-CM | POA: Diagnosis not present

## 2022-12-07 LAB — CBC
HCT: 37.6 % (ref 36.0–46.0)
Hemoglobin: 12.6 g/dL (ref 12.0–15.0)
MCH: 31.3 pg (ref 26.0–34.0)
MCHC: 33.5 g/dL (ref 30.0–36.0)
MCV: 93.5 fL (ref 80.0–100.0)
Platelets: 267 10*3/uL (ref 150–400)
RBC: 4.02 MIL/uL (ref 3.87–5.11)
RDW: 12 % (ref 11.5–15.5)
WBC: 6 10*3/uL (ref 4.0–10.5)
nRBC: 0 % (ref 0.0–0.2)

## 2022-12-13 MED ORDER — ORAL CARE MOUTH RINSE: 15.0000 mL | Freq: Once | OROMUCOSAL | Status: AC

## 2022-12-13 MED ORDER — LACTATED RINGERS IV SOLN: INTRAVENOUS | Status: DC

## 2022-12-13 MED ORDER — CHLORHEXIDINE GLUCONATE 0.12 % MT SOLN
15.0000 mL | Freq: Once | OROMUCOSAL | Status: AC
  Administered 2022-12-14: 15 mL via OROMUCOSAL

## 2022-12-13 MED ORDER — FAMOTIDINE 20 MG PO TABS
20.0000 mg | ORAL_TABLET | Freq: Once | ORAL | Status: AC
  Administered 2022-12-14: 20 mg via ORAL

## 2022-12-13 MED ORDER — CEFAZOLIN SODIUM-DEXTROSE 2-4 GM/100ML-% IV SOLN
2.0000 g | INTRAVENOUS | Status: AC
  Administered 2022-12-14: 2 g via INTRAVENOUS

## 2022-12-14 ENCOUNTER — Encounter
Admission: RE | Disposition: A | Discharge status: Home / Self Care | Payer: Self-pay | Source: Home / Self Care | Attending: Surgery

## 2022-12-14 ENCOUNTER — Other Ambulatory Visit: Payer: Self-pay

## 2022-12-14 ENCOUNTER — Ambulatory Visit: Payer: 59 | Admitting: Certified Registered"

## 2022-12-14 ENCOUNTER — Encounter: Payer: Self-pay | Admitting: Surgery

## 2022-12-14 ENCOUNTER — Ambulatory Visit
Admission: RE | Admit: 2022-12-14 | Discharge: 2022-12-14 | Disposition: A | Discharge status: Home / Self Care | Payer: 59 | Attending: Surgery | Admitting: Surgery

## 2022-12-14 DIAGNOSIS — Z79899 Other long term (current) drug therapy: Secondary | ICD-10-CM | POA: Diagnosis not present

## 2022-12-14 DIAGNOSIS — R7303 Prediabetes: Secondary | ICD-10-CM | POA: Insufficient documentation

## 2022-12-14 DIAGNOSIS — G5601 Carpal tunnel syndrome, right upper limb: Secondary | ICD-10-CM | POA: Insufficient documentation

## 2022-12-14 DIAGNOSIS — E7800 Pure hypercholesterolemia, unspecified: Secondary | ICD-10-CM | POA: Diagnosis not present

## 2022-12-14 DIAGNOSIS — F419 Anxiety disorder, unspecified: Secondary | ICD-10-CM | POA: Insufficient documentation

## 2022-12-14 HISTORY — PX: CARPAL TUNNEL RELEASE: SHX101

## 2022-12-14 SURGERY — RELEASE, CARPAL TUNNEL, ENDOSCOPIC
Anesthesia: General | Site: Wrist | Laterality: Right

## 2022-12-14 MED ORDER — FENTANYL CITRATE (PF) 100 MCG/2ML IJ SOLN: 25.0000 ug | INTRAMUSCULAR | Status: DC | PRN

## 2022-12-14 MED ORDER — ACETAMINOPHEN 10 MG/ML IV SOLN
INTRAVENOUS | Status: DC | PRN
  Administered 2022-12-14: 1000 mg via INTRAVENOUS

## 2022-12-14 MED ORDER — FENTANYL CITRATE (PF) 100 MCG/2ML IJ SOLN
INTRAMUSCULAR | Status: AC
  Filled 2022-12-14: qty 2

## 2022-12-14 MED ORDER — OXYCODONE HCL 5 MG/5ML PO SOLN: 5.0000 mg | Freq: Once | ORAL | Status: DC | PRN

## 2022-12-14 MED ORDER — 0.9 % SODIUM CHLORIDE (POUR BTL) OPTIME
TOPICAL | Status: DC | PRN
  Administered 2022-12-14: 500 mL

## 2022-12-14 MED ORDER — ONDANSETRON HCL 4 MG/2ML IJ SOLN
INTRAMUSCULAR | Status: DC | PRN
  Administered 2022-12-14: 4 mg via INTRAVENOUS

## 2022-12-14 MED ORDER — PROPOFOL 10 MG/ML IV BOLUS
INTRAVENOUS | Status: AC
  Filled 2022-12-14: qty 20

## 2022-12-14 MED ORDER — OXYCODONE HCL 5 MG PO TABS: 5.0000 mg | ORAL_TABLET | Freq: Once | ORAL | Status: DC | PRN

## 2022-12-14 MED ORDER — CHLORHEXIDINE GLUCONATE 0.12 % MT SOLN
OROMUCOSAL | Status: AC
  Filled 2022-12-14: qty 15

## 2022-12-14 MED ORDER — FENTANYL CITRATE (PF) 100 MCG/2ML IJ SOLN
INTRAMUSCULAR | Status: DC | PRN
  Administered 2022-12-14 (×4): 25 ug via INTRAVENOUS

## 2022-12-14 MED ORDER — DEXAMETHASONE SODIUM PHOSPHATE 10 MG/ML IJ SOLN
INTRAMUSCULAR | Status: DC | PRN
  Administered 2022-12-14: 10 mg via INTRAVENOUS

## 2022-12-14 MED ORDER — ACETAMINOPHEN 10 MG/ML IV SOLN
INTRAVENOUS | Status: AC
  Filled 2022-12-14: qty 100

## 2022-12-14 MED ORDER — EPHEDRINE SULFATE (PRESSORS) 50 MG/ML IJ SOLN
INTRAMUSCULAR | Status: DC | PRN
  Administered 2022-12-14 (×2): 10 mg via INTRAVENOUS
  Administered 2022-12-14: 5 mg via INTRAVENOUS

## 2022-12-14 MED ORDER — PROPOFOL 10 MG/ML IV BOLUS
INTRAVENOUS | Status: DC | PRN
  Administered 2022-12-14: 50 mg via INTRAVENOUS
  Administered 2022-12-14: 150 mg via INTRAVENOUS

## 2022-12-14 MED ORDER — DEXAMETHASONE SODIUM PHOSPHATE 10 MG/ML IJ SOLN
INTRAMUSCULAR | Status: AC
  Filled 2022-12-14: qty 1

## 2022-12-14 MED ORDER — LIDOCAINE HCL (PF) 2 % IJ SOLN
INTRAMUSCULAR | Status: AC
  Filled 2022-12-14: qty 5

## 2022-12-14 MED ORDER — FAMOTIDINE 20 MG PO TABS
ORAL_TABLET | ORAL | Status: AC
  Filled 2022-12-14: qty 1

## 2022-12-14 MED ORDER — BUPIVACAINE HCL (PF) 0.5 % IJ SOLN
INTRAMUSCULAR | Status: AC
  Filled 2022-12-14: qty 30

## 2022-12-14 MED ORDER — MIDAZOLAM HCL 2 MG/2ML IJ SOLN
INTRAMUSCULAR | Status: AC
  Filled 2022-12-14: qty 2

## 2022-12-14 MED ORDER — PHENYLEPHRINE HCL (PRESSORS) 10 MG/ML IV SOLN
INTRAVENOUS | Status: DC | PRN
  Administered 2022-12-14: 80 ug via INTRAVENOUS

## 2022-12-14 MED ORDER — MIDAZOLAM HCL 2 MG/2ML IJ SOLN
INTRAMUSCULAR | Status: DC | PRN
  Administered 2022-12-14: 2 mg via INTRAVENOUS

## 2022-12-14 MED ORDER — LIDOCAINE HCL (CARDIAC) PF 100 MG/5ML IV SOSY
PREFILLED_SYRINGE | INTRAVENOUS | Status: DC | PRN
  Administered 2022-12-14: 80 mg via INTRAVENOUS

## 2022-12-14 MED ORDER — ONDANSETRON HCL 4 MG/2ML IJ SOLN
INTRAMUSCULAR | Status: AC
  Filled 2022-12-14: qty 2

## 2022-12-14 MED ORDER — BUPIVACAINE HCL (PF) 0.5 % IJ SOLN
INTRAMUSCULAR | Status: DC | PRN
  Administered 2022-12-14: 10 mL

## 2022-12-14 MED ORDER — CEFAZOLIN SODIUM-DEXTROSE 2-4 GM/100ML-% IV SOLN
INTRAVENOUS | Status: AC
  Filled 2022-12-14: qty 100

## 2022-12-14 SURGICAL SUPPLY — 36 items
APL PRP STRL LF DISP 70% ISPRP (MISCELLANEOUS) ×1
BNDG CMPR 5X2 KNTD ELC UNQ LF (GAUZE/BANDAGES/DRESSINGS) ×1
BNDG CMPR 5X4 CHSV STRCH STRL (GAUZE/BANDAGES/DRESSINGS) ×1
BNDG COHESIVE 4X5 TAN STRL LF (GAUZE/BANDAGES/DRESSINGS) ×1 IMPLANT
BNDG ELASTIC 2INX 5YD STR LF (GAUZE/BANDAGES/DRESSINGS) ×1 IMPLANT
BNDG ESMARCH 4 X 12 STRL LF (GAUZE/BANDAGES/DRESSINGS) ×1
BNDG ESMARCH 4X12 STRL LF (GAUZE/BANDAGES/DRESSINGS) ×1 IMPLANT
CHLORAPREP W/TINT 26 (MISCELLANEOUS) ×1 IMPLANT
CORD BIP STRL DISP 12FT (MISCELLANEOUS) ×1 IMPLANT
CUFF TOURN SGL QUICK 18X4 (TOURNIQUET CUFF) ×1 IMPLANT
DRAPE SURG 17X11 SM STRL (DRAPES) ×1 IMPLANT
FORCEPS JEWEL BIP 4-3/4 STR (INSTRUMENTS) ×1 IMPLANT
GAUZE SPONGE 4X4 12PLY STRL (GAUZE/BANDAGES/DRESSINGS) ×1 IMPLANT
GAUZE XEROFORM 1X8 LF (GAUZE/BANDAGES/DRESSINGS) ×1 IMPLANT
GLOVE BIO SURGEON STRL SZ8 (GLOVE) ×1 IMPLANT
GLOVE INDICATOR 8.0 STRL GRN (GLOVE) ×1 IMPLANT
GOWN STRL REUS W/ TWL LRG LVL3 (GOWN DISPOSABLE) ×1 IMPLANT
GOWN STRL REUS W/ TWL XL LVL3 (GOWN DISPOSABLE) ×1 IMPLANT
GOWN STRL REUS W/TWL LRG LVL3 (GOWN DISPOSABLE) ×1
GOWN STRL REUS W/TWL XL LVL3 (GOWN DISPOSABLE) ×1
KIT CARPAL TUNNEL (MISCELLANEOUS) ×1
KIT ESCP INSRT D SLOT CANN KN (MISCELLANEOUS) ×1 IMPLANT
KIT TURNOVER KIT A (KITS) ×1 IMPLANT
MANIFOLD NEPTUNE II (INSTRUMENTS) ×1 IMPLANT
NS IRRIG 500ML POUR BTL (IV SOLUTION) ×1 IMPLANT
PACK EXTREMITY ARMC (MISCELLANEOUS) ×1 IMPLANT
SPLINT WRIST LG LT TX990309 (SOFTGOODS) IMPLANT
SPLINT WRIST LG RT TX900304 (SOFTGOODS) IMPLANT
SPLINT WRIST M LT TX990308 (SOFTGOODS) IMPLANT
SPLINT WRIST M RT TX990303 (SOFTGOODS) IMPLANT
SPLINT WRIST XL LT TX990310 (SOFTGOODS) IMPLANT
SPLINT WRIST XL RT TX990305 (SOFTGOODS) IMPLANT
STOCKINETTE IMPERVIOUS 9X36 MD (GAUZE/BANDAGES/DRESSINGS) ×1 IMPLANT
SUT PROLENE 4 0 PS 2 18 (SUTURE) ×1 IMPLANT
TRAP FLUID SMOKE EVACUATOR (MISCELLANEOUS) ×1 IMPLANT
WATER STERILE IRR 500ML POUR (IV SOLUTION) ×1 IMPLANT

## 2022-12-14 NOTE — Transfer of Care (Signed)
Immediate Anesthesia Transfer of Care Note  Patient: Ritika Schmelz  Procedure(s) Performed: CARPAL TUNNEL RELEASE ENDOSCOPIC (Right: Wrist)  Patient Location: PACU  Anesthesia Type:General  Level of Consciousness: drowsy  Airway & Oxygen Therapy: Patient Spontanous Breathing and Patient connected to face mask oxygen  Post-op Assessment: Report given to RN and Post -op Vital signs reviewed and stable  Post vital signs: Reviewed and stable  Last Vitals:  Vitals Value Taken Time  BP 113/56 12/14/22 1101  Temp 35.8 1101  Pulse 54 12/14/22 1103  Resp 12 12/14/22 1103  SpO2 100 % 12/14/22 1103  Vitals shown include unvalidated device data.  Last Pain:  Vitals:   12/14/22 0926  PainSc: 0-No pain         Complications: No notable events documented.

## 2022-12-14 NOTE — Anesthesia Procedure Notes (Signed)
Procedure Name: LMA Insertion Date/Time: 12/14/2022 10:23 AM  Performed by: Morene Crocker, CRNAPre-anesthesia Checklist: Patient identified, Patient being monitored, Timeout performed, Emergency Drugs available and Suction available Patient Re-evaluated:Patient Re-evaluated prior to induction Oxygen Delivery Method: Circle system utilized Preoxygenation: Pre-oxygenation with 100% oxygen Induction Type: IV induction Ventilation: Mask ventilation without difficulty LMA: LMA inserted LMA Size: 4.0 Tube type: Oral Number of attempts: 1 Placement Confirmation: positive ETCO2 and breath sounds checked- equal and bilateral Tube secured with: Tape Dental Injury: Teeth and Oropharynx as per pre-operative assessment  Comments: Smooth atraumatic LMA placement

## 2022-12-14 NOTE — H&P (Signed)
History of Present Illness:  Alyssa Wu is a 62 y.o. female who presents for evaluation and treatment of her bilateral hand and wrist pain and paresthesias, right more symptomatic than left. These symptoms have been present for many years but have gradually been worsening recently, prompting her to make this appointment. 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 She notes that her symptoms are worse with any repetitive activities, as well as when she is driving. She finds herself frequently "shaking out my hands" in order to alleviate the symptoms. She also has pain at night which will awaken her from sleep. She has tried ibuprofen and splinting with limited benefit.  Current Outpatient Medications  Medication Sig Dispense Refill  ALPRAZolam (XANAX) 0.25 MG tablet Take 1 tablet (0.25 mg total) by mouth at bedtime as needed for Sleep 30 tablet 0  cholecalciferol 1000 unit tablet  lactobacillus combination no.4 (PROBIOTIC) 3 billion cell Cap  multivitamin (THERAGRAN) tablet 1 tab by mouth daily  PARoxetine (PAXIL CR) 25 MG CR tablet Take 1 tablet (25 mg total) by mouth once daily 90 tablet 4  topiramate (TOPAMAX) 25 MG tablet Take 2 tablets (50 mg total) by mouth at bedtime 180 tablet 4  VALTREX 500 mg tablet Take 1 tablet (500 mg total) by mouth once daily 90 tablet 3   Allergies:  Imitrex [Sumatriptan Succinate] Unknown  Maxalt [Rizatriptan] Unknown  Trazodone Other (nightmares)   Past Medical History:  Diagnosis Date  Anxiety  Chronic headache  Insomnia  Recurrent sinusitis   Past Surgical History:  HYSTERECTOMY 1996 w/o BSO  HERNIA REPAIR 07/2011 (supraumbilical ventral)  COLONOSCOPY 02/28/2020 (Tubular adenomas/Repeat 40yrs/TKT)  Bunion removal Bilateral  CYSTOCELE REPAIR   TONSILLECTOMY   Family History:  Diabetes type II Father  Alcohol abuse Sister  Alcohol abuse Brother   Social History:   Socioeconomic History:  Marital status: Married  Tobacco Use  Smoking status: Never  Smokeless tobacco: Never  Vaping Use  Vaping status: Never Used  Substance and Sexual Activity  Alcohol use: No  Drug use: No   Social Determinants of Health:   Physicist, medical Strain: Medium Risk (09/29/2022)  Overall Financial Resource Strain (CARDIA)  Difficulty of Paying Living Expenses: Somewhat hard  Food Insecurity: No Food Insecurity (09/29/2022)  Hunger Vital Sign  Worried About Running Out of Food in the Last Year: Never true  Ran Out of Food in the Last Year: Never true  Transportation Needs: No Transportation Needs (09/29/2022)  PRAPARE - Risk analyst (Medical): No  Lack of Transportation (Non-Medical): No   Review of Systems:  A comprehensive 14 point ROS was performed, reviewed, and the pertinent orthopaedic findings are documented in the HPI.  Physical Exam: Vitals:  11/14/22 1133  BP: 118/76  Weight: 84.3 kg (185 lb 12.8 oz)  Height: 157.5 cm (5\' 2" )  PainSc: 6  PainLoc: Hand   General/Constitutional: The patient appears to be well-nourished, well-developed, and in no acute distress. Neuro/Psych: Normal mood and affect, oriented to person, place and time. Eyes: Non-icteric. Pupils are equal, round, and reactive to light, and exhibit synchronous movement. ENT: Unremarkable. Lymphatic: No palpable adenopathy. Respiratory: Lungs clear to auscultation, Normal chest excursion, No wheezes, and Non-labored breathing Cardiovascular: Regular rate and rhythm. No murmurs. and No edema, swelling or tenderness, except as noted  in detailed exam. Integumentary: No impressive skin lesions present, except as noted in detailed exam. Musculoskeletal: Unremarkable, except as noted in detailed exam.  Right wrist/hand exam: Skin  inspection of the right wrist and hand is unremarkable. No swelling, erythema, ecchymosis, abrasions, or other skin abnormalities are identified. She has no tenderness to the dorsal or palmar aspects of her wrist, nor does she have any tenderness to the dorsal or palmar aspects of her hand. She exhibits full active and passive range of motion of the wrist without any pain or catching. She is able actively flex and extend all digits fully without any pain or triggering. She is neurovascularly intact to all digits. She exhibits a positive Phalen's test as well as an equivocally positive Tinel's over the carpal tunnel.  Assessment: 1. Carpal tunnel syndrome, right.   Plan: The treatment options were discussed with the patient. In addition, patient educational materials were provided regarding the diagnosis and treatment options. The patient is quite frustrated by her symptoms and functional limitations, especially as they pertain to her right wrist and hand, and is ready to consider more aggressive treatment options. Therefore, I have recommended a surgical procedure, specifically an endoscopic right carpal tunnel release. The procedure was discussed with the patient, as were the potential risks (including bleeding, infection, nerve and/or blood vessel injury, persistent or recurrent pain/paresthesias, weakness of grip, need for further surgery, blood clots, strokes, heart attacks and/or arhythmias, pneumonia, etc.) and benefits. The patient states his/her understanding and wishes to proceed. All of the patient's questions and concerns were answered. She can call any time with further concerns. She will follow up post-surgery, routine.    H&P reviewed and patient re-examined. No changes.

## 2022-12-14 NOTE — Anesthesia Preprocedure Evaluation (Signed)
Anesthesia Evaluation  Patient identified by MRN, date of birth, ID band Patient awake    Reviewed: Allergy & Precautions, NPO status , Patient's Chart, lab work & pertinent test results  History of Anesthesia Complications Negative for: history of anesthetic complications  Airway Mallampati: II  TM Distance: >3 FB Neck ROM: full    Dental no notable dental hx.    Pulmonary neg pulmonary ROS   Pulmonary exam normal        Cardiovascular negative cardio ROS Normal cardiovascular exam     Neuro/Psych  Headaches PSYCHIATRIC DISORDERS Anxiety        GI/Hepatic negative GI ROS, Neg liver ROS,,,  Endo/Other  negative endocrine ROS    Renal/GU      Musculoskeletal   Abdominal   Peds  Hematology negative hematology ROS (+)   Anesthesia Other Findings Past Medical History: No date: Anxiety No date: Headache No date: Hypercholesteremia No date: Insomnia No date: Portal vein thrombosis No date: Pre-diabetes  Past Surgical History: No date: BUNIONECTOMY; Bilateral No date: COLONOSCOPY No date: CYSTOCELE REPAIR No date: HERNIA REPAIR No date: PARTIAL HYSTERECTOMY No date: TONSILLECTOMY  BMI    Body Mass Index: 30.90 kg/m      Reproductive/Obstetrics negative OB ROS                             Anesthesia Physical Anesthesia Plan  ASA: 2  Anesthesia Plan: General LMA   Post-op Pain Management: Toradol IV (intra-op)* and Ofirmev IV (intra-op)*   Induction: Intravenous  PONV Risk Score and Plan: 3 and Dexamethasone, Ondansetron, Midazolam and Treatment may vary due to age or medical condition  Airway Management Planned: LMA  Additional Equipment:   Intra-op Plan:   Post-operative Plan: Extubation in OR  Informed Consent: I have reviewed the patients History and Physical, chart, labs and discussed the procedure including the risks, benefits and alternatives for the proposed  anesthesia with the patient or authorized representative who has indicated his/her understanding and acceptance.     Dental Advisory Given  Plan Discussed with: Anesthesiologist, CRNA and Surgeon  Anesthesia Plan Comments: (Patient consented for risks of anesthesia including but not limited to:  - adverse reactions to medications - damage to eyes, teeth, lips or other oral mucosa - nerve damage due to positioning  - sore throat or hoarseness - Damage to heart, brain, nerves, lungs, other parts of body or loss of life  Patient voiced understanding.)       Anesthesia Quick Evaluation

## 2022-12-14 NOTE — Op Note (Signed)
12/14/2022  11:08 AM  Patient:   Alyssa Wu  Pre-Op Diagnosis:   Right carpal tunnel syndrome.  Post-Op Diagnosis:   Same.  Procedure:   Endoscopic right carpal tunnel release.  Surgeon:   Maryagnes Amos, MD  Anesthesia:   General LMA  Findings:   As above.  Complications:   None  EBL:   0 cc  Fluids:   700 cc crystalloid  TT:   12 minutes at 250 mmHg  Drains:   None  Closure:   4-0 Prolene interrupted sutures  Brief Clinical Note:   The patient is a 62 year old female with a history of progressive worsening pain and paresthesias to her right hand. Her symptoms have progressed despite medications, activity modification, etc. Her history and examination are consistent with carpal tunnel syndrome. The patient presents at this time for an endoscopic right carpal tunnel release.   Procedure:   The patient was brought into the operating room and lain in the supine position. After adequate general laryngeal mask anesthesia was obtained, the right hand and upper extremity were prepped with ChloraPrep solution before being draped sterilely. Preoperative antibiotics were administered. A timeout was performed to verify the appropriate surgical site before the limb was exsanguinated with an Esmarch and the tourniquet inflated to 250 mmHg.   An approximately 1.5-2 cm incision was made over the volar wrist flexion crease, centered over the palmaris longus tendon. The incision was carried down through the subcutaneous tissues with care taken to identify and protect any neurovascular structures. The distal forearm fascia was penetrated just proximal to the transverse carpal ligament. The soft tissues were released off the superficial and deep surfaces of the distal forearm fascia and this was released proximally for 3-4 cm under direct visualization.  Attention was directed distally. The Therapist, nutritional was passed beneath the transverse carpal ligament along the ulnar aspect of the carpal  tunnel and used to release any adhesions as well as to remove any adherent synovial tissue before first the smaller then the larger of the two dilators were passed beneath the transverse carpal ligament along the ulnar margin of the carpal tunnel. The slotted cannula was introduced and the endoscope was placed into the slotted cannula and the undersurface of the transverse carpal ligament visualized. The distal margin of the transverse carpal ligament was marked by placing a 25-gauge needle percutaneously at Kaplan's cardinal point so that it entered the distal portion of the slotted cannula. Under endoscopic visualization, the transverse carpal ligament was released from proximal to distal using the end-cutting blade. A second pass was performed to ensure complete release of the ligament. The adequacy of release was verified both endoscopically and by palpation using the freer elevator.  The wound was irrigated thoroughly with sterile saline solution before being closed using 4-0 Prolene interrupted sutures. A total of 10 cc of 0.5% plain Sensorcaine was injected in and around the incision before a sterile bulky dressing was applied to the wound. The patient was placed into a volar wrist splint before being awakened, extubated, and returned to the recovery room in satisfactory condition after tolerating the procedure well.

## 2022-12-14 NOTE — Discharge Instructions (Addendum)
Orthopedic discharge instructions: Keep dressing dry and intact. Keep hand elevated above heart level. May shower after dressing removed on postop day 4 (Sunday). Cover sutures with Band-Aids after drying off, then reapply Velcro splint. Apply ice to affected area frequently. Take ibuprofen 600-800 mg TID with meals for 3-5 days, then as necessary. May supplement with ES Tylenol if needed.  May resume baby aspirin tomorrow morning. Return for follow-up in 10-14 days or as scheduled.    AMBULATORY SURGERY  DISCHARGE INSTRUCTIONS   The drugs that you were given will stay in your system until tomorrow so for the next 24 hours you should not:  Drive an automobile Make any legal decisions Drink any alcoholic beverage   You may resume regular meals tomorrow.  Today it is better to start with liquids and gradually work up to solid foods.  You may eat anything you prefer, but it is better to start with liquids, then soup and crackers, and gradually work up to solid foods.   Please notify your doctor immediately if you have any unusual bleeding, trouble breathing, redness and pain at the surgery site, drainage, fever, or pain not relieved by medication.    Additional Instructions:        Please contact your physician with any problems or Same Day Surgery at [phone], Monday through Friday 6 am to 4 pm, or [place] [facility] number at [phone].

## 2022-12-14 NOTE — Anesthesia Postprocedure Evaluation (Signed)
Anesthesia Post Note  Patient: Taziyah Koplin  Procedure(s) Performed: CARPAL TUNNEL RELEASE ENDOSCOPIC (Right: Wrist)  Patient location during evaluation: PACU Anesthesia Type: General Level of consciousness: awake and alert Pain management: pain level controlled Vital Signs Assessment: post-procedure vital signs reviewed and stable Respiratory status: spontaneous breathing, nonlabored ventilation, respiratory function stable and patient connected to nasal cannula oxygen Cardiovascular status: blood pressure returned to baseline and stable Postop Assessment: no apparent nausea or vomiting Anesthetic complications: no   No notable events documented.   Last Vitals:  Vitals:   12/14/22 1115 12/14/22 1125  BP: 127/60 (!) 129/55  Pulse: 62 (!) 56  Resp: 16 19  Temp:  (!) 36.2 C  SpO2: 99% 99%    Last Pain:  Vitals:   12/14/22 1125  PainSc: 0-No pain                 Louie Boston

## 2023-03-30 ENCOUNTER — Other Ambulatory Visit: Payer: Self-pay | Admitting: Family Medicine

## 2023-03-30 DIAGNOSIS — R102 Pelvic and perineal pain: Secondary | ICD-10-CM

## 2023-04-04 ENCOUNTER — Ambulatory Visit
Admission: RE | Admit: 2023-04-04 | Discharge: 2023-04-04 | Disposition: A | Discharge status: Home / Self Care | Payer: 59 | Source: Ambulatory Visit | Attending: Family Medicine | Admitting: Family Medicine

## 2023-04-04 DIAGNOSIS — R102 Pelvic and perineal pain: Secondary | ICD-10-CM | POA: Insufficient documentation

## 2023-05-15 ENCOUNTER — Ambulatory Visit: Payer: 59

## 2023-05-15 DIAGNOSIS — Z860101 Personal history of adenomatous and serrated colon polyps: Secondary | ICD-10-CM | POA: Diagnosis not present

## 2023-05-15 DIAGNOSIS — K640 First degree hemorrhoids: Secondary | ICD-10-CM | POA: Diagnosis not present

## 2023-05-15 DIAGNOSIS — K5730 Diverticulosis of large intestine without perforation or abscess without bleeding: Secondary | ICD-10-CM | POA: Diagnosis not present

## 2023-05-15 DIAGNOSIS — Z09 Encounter for follow-up examination after completed treatment for conditions other than malignant neoplasm: Secondary | ICD-10-CM | POA: Diagnosis present
